# Patient Record
Sex: Male | Born: 1980 | Race: White | Hispanic: No | Marital: Married | Smoking: Never smoker
Health system: Southern US, Community
[De-identification: ages and names within clinical notes are randomized; demographics above are authoritative.]

## PROBLEM LIST (undated history)

## (undated) DIAGNOSIS — R011 Cardiac murmur, unspecified: Secondary | ICD-10-CM

## (undated) DIAGNOSIS — Z8619 Personal history of other infectious and parasitic diseases: Secondary | ICD-10-CM

## (undated) HISTORY — DX: Personal history of other infectious and parasitic diseases: Z86.19

## (undated) HISTORY — PX: HERNIA REPAIR: SHX51

## (undated) HISTORY — DX: Cardiac murmur, unspecified: R01.1

---
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
## 2010-08-15 ENCOUNTER — Encounter: Payer: Self-pay | Admitting: Internal Medicine

## 2010-08-15 ENCOUNTER — Ambulatory Visit (INDEPENDENT_AMBULATORY_CARE_PROVIDER_SITE_OTHER): Payer: BC Managed Care – PPO | Admitting: Internal Medicine

## 2010-08-15 VITALS — BP 120/74 | HR 68 | Temp 98.1°F | Resp 16 | Ht 78.25 in | Wt 200.0 lb

## 2010-08-15 DIAGNOSIS — Z0000 Encounter for general adult medical examination without abnormal findings: Secondary | ICD-10-CM

## 2010-08-15 MED ORDER — TRIAMCINOLONE ACETONIDE 0.5 % EX CREA: TOPICAL_CREAM | Freq: Two times a day (BID) | CUTANEOUS | Status: AC

## 2010-08-15 NOTE — Progress Notes (Signed)
  Subjective:    Patient ID: Lance Jordan, male    DOB: [birth year], 30 y.o.   MRN: [id number]  HPI  30 year old patient who is seen today to establish with our practice he has enjoyed excellent health. There was some concern about mild hypertension in the past but with weight loss he has been normotensive. He is never required any treatment. He enjoys excellent health without concerns or complaints. Past medical history is unremarkable. No surgery he does have a history of Osgood-Schlatter's disease when he was younger. He is very active physically and has no exercise limitations or knee pain. 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 He works for [employer]. He is married one 96-month-old child and expecting his second child. Nonsmoker does exercise regularly Family history father and mother both age 82 in excellent health one sister is also in good health. Maternal grandmother history of ovarian cancer paternal grandmother breast cancer paternal grandfather prostate cancer mother history of mild dyslipidemia    Review of Systems  Constitutional: Negative for fever, chills, activity change, appetite change and fatigue.  HENT: Negative for hearing loss, ear pain, congestion, rhinorrhea, sneezing, mouth sores, trouble swallowing, neck pain, neck stiffness, dental problem, voice change, sinus pressure and tinnitus.   Eyes: Negative for photophobia, pain, redness and visual disturbance.  Respiratory: Negative for apnea, cough, choking, chest tightness, shortness of breath and wheezing.   Cardiovascular: Negative for chest pain, palpitations and leg swelling.  Gastrointestinal: Negative for nausea, vomiting, abdominal pain, diarrhea, constipation, blood in stool, abdominal distention, anal bleeding and rectal pain.  Genitourinary: Negative for dysuria, urgency, frequency, hematuria, flank pain, decreased urine volume, discharge,  penile swelling, scrotal swelling, difficulty urinating, genital sores and testicular pain.  Musculoskeletal: Negative for myalgias, back pain, joint swelling, arthralgias and gait problem.  Skin: Negative for color change, rash and wound.  Neurological: Negative for dizziness, tremors, seizures, syncope, facial asymmetry, speech difficulty, weakness, light-headedness, numbness and headaches.  Hematological: Negative for adenopathy. Does not bruise/bleed easily.  Psychiatric/Behavioral: Negative for suicidal ideas, hallucinations, behavioral problems, confusion, sleep disturbance, self-injury, dysphoric mood, decreased concentration and agitation. The patient is not nervous/anxious.        Objective:   Physical Exam  Constitutional: He is oriented to person, place, and time. He appears well-developed.  HENT:  Head: Normocephalic.  Right Ear: External ear normal.  Left Ear: External ear normal.  Eyes: Conjunctivae and EOM are normal.  Neck: Normal range of motion.  Cardiovascular: Normal rate and normal heart sounds.   Pulmonary/Chest: Breath sounds normal.  Abdominal: Bowel sounds are normal.  Musculoskeletal: Normal range of motion. He exhibits no edema and no tenderness.  Neurological: He is alert and oriented to person, place, and time.  Psychiatric: He has a normal mood and affect. His behavior is normal.          Assessment & Plan:   Unremarkable health maintenance examination. Exercise encouraged. Will return here when necessary.

## 2010-08-15 NOTE — Patient Instructions (Signed)
It is important that you exercise regularly, at least 20 minutes 3 to 4 times per week.  If you develop chest pain or shortness of breath seek  medical attention.  Call or return to clinic prn if these symptoms worsen or fail to improve as anticipated.  

## 2010-12-12 ENCOUNTER — Encounter: Payer: Self-pay | Admitting: Internal Medicine

## 2010-12-12 ENCOUNTER — Ambulatory Visit (INDEPENDENT_AMBULATORY_CARE_PROVIDER_SITE_OTHER): Payer: BC Managed Care – PPO | Admitting: Internal Medicine

## 2010-12-12 VITALS — BP 160/100 | Temp 98.2°F | Wt 205.0 lb

## 2010-12-12 DIAGNOSIS — L290 Pruritus ani: Secondary | ICD-10-CM

## 2010-12-12 MED ORDER — HYDROCORTISONE 2.5 % RE CREA: TOPICAL_CREAM | RECTAL | Status: AC

## 2010-12-12 NOTE — Patient Instructions (Signed)
Call or return to clinic prn if these symptoms worsen or fail to improve as anticipated.

## 2010-12-12 NOTE — Progress Notes (Signed)
  Subjective:    Patient ID: Lance Jordan, male    DOB: [birth year], 30 y.o.   MRN: [id number]  HPI  30 -year-old patient who presents with a several week history of intermittent perirectal pain, intermittent bleeding and itching. He does exercise regularly which includes workouts on an exercise bike.    Review of Systems  Skin:       Perirectal itching and bleeding       Objective:   Physical Exam  Constitutional:       BP blood pressure 150/70  Genitourinary:       The skin in the perirectal area was quite thickened erythematous with fissuring. No obvious hemorrhoid noted          Assessment & Plan:   Pruritus and with chronic perirectal dermatitis. Local skin care discussed he will be treated with short-term Anusol-HC cream. He will avoid alert vigorous cleansing, trauma and  attempt to keep the area clean and dry and free of moisture

## 2011-12-07 ENCOUNTER — Encounter: Payer: Self-pay | Admitting: Internal Medicine

## 2011-12-07 ENCOUNTER — Ambulatory Visit (INDEPENDENT_AMBULATORY_CARE_PROVIDER_SITE_OTHER): Payer: BC Managed Care – PPO | Admitting: Internal Medicine

## 2011-12-07 VITALS — BP 160/80 | HR 84 | Temp 98.6°F | Resp 18 | Wt 209.0 lb

## 2011-12-07 DIAGNOSIS — L723 Sebaceous cyst: Secondary | ICD-10-CM

## 2011-12-07 NOTE — Patient Instructions (Signed)
Call or return to clinic prn if these symptoms worsen or fail to improve as anticipated.

## 2011-12-07 NOTE — Progress Notes (Signed)
  Subjective:    Patient ID: Lance Jordan, male    DOB: [birth year], 31 y.o.   MRN: [id number]  HPI  31 year old patient nonsmoker who presents with a two-day history of a tender nodule in the left lateral neck area.  Past Medical History  Diagnosis Date  . History of chicken pox     History   Social History  . Marital Status: Married    Spouse Name: N/A    Number of Children: N/A  . Years of Education: N/A   Occupational History  . Not on file.   Social History Main Topics  . Smoking status: Never Smoker   . Smokeless tobacco: Never Used  . Alcohol Use: Yes  . Drug Use: No  . Sexually Active: Not on file   Other Topics Concern  . Not on file   Social History Narrative  . No narrative on file    No past surgical history on file.  Family History  Problem Relation Age of Onset  . Hyperlipidemia Mother   . Cancer Maternal Grandmother     ovarian ca  . Hyperlipidemia Paternal Grandmother   . Arthritis Paternal Grandmother   . Heart disease Paternal Grandmother   . Cancer Paternal Grandmother     breast ca    No Known Allergies  Current Outpatient Prescriptions on File Prior to Visit  Medication Sig Dispense Refill  . hydrocortisone (ANUSOL-HC) 2.5 % rectal cream Apply rectally 3  times daily  30 g  1    BP 160/80  Pulse 84  Temp 98.6 F (37 C) (Oral)  Resp 18  Wt 209 lb (94.802 kg)       Review of Systems  Skin: Positive for wound.       Objective:   Physical Exam  Constitutional: He appears well-developed and well-nourished. No distress.  Skin:       And 8-10 mm slightly tender nodule in the left lateral neck area at the junction of his beard and shaved area. This appears to be a slightly inflamed sebaceous cyst and not adenopathy          Assessment & Plan:   Mildly inflamed  sebaceous cyst. Local skin care discussed. Will call if unimproved

## 2014-10-04 ENCOUNTER — Telehealth: Payer: Self-pay | Admitting: Internal Medicine

## 2014-10-04 NOTE — Telephone Encounter (Signed)
Pt will call back in the am with that information

## 2014-10-04 NOTE — Telephone Encounter (Signed)
What Titer does he need?

## 2014-10-04 NOTE — Telephone Encounter (Signed)
[facility] and they need immunization list. Pt does not have and is requesting a titer. Ok to schedule?

## 2014-10-05 NOTE — Telephone Encounter (Signed)
Can I work him in somwhere?

## 2014-10-05 NOTE — Telephone Encounter (Signed)
Yes, you can

## 2014-10-05 NOTE — Telephone Encounter (Signed)
Pt has been scheduled.  °

## 2014-10-05 NOTE — Telephone Encounter (Signed)
Please schedule physical for pt and then when seen can order titers. Please make sure pt brings along paperwork. Pt not seen since 2013.

## 2014-10-05 NOTE — Telephone Encounter (Signed)
Pt states his paperwork needs MMR, tdap, varicella, hep b and may need mennigitis.  He can probably just get the tdap and heb b at visit. Please advise on how to schedule. Thanks.

## 2014-10-25 ENCOUNTER — Encounter: Payer: Self-pay | Admitting: Internal Medicine

## 2014-10-25 ENCOUNTER — Ambulatory Visit (INDEPENDENT_AMBULATORY_CARE_PROVIDER_SITE_OTHER): Payer: BLUE CROSS/BLUE SHIELD | Admitting: Internal Medicine

## 2014-10-25 VITALS — BP 144/80 | HR 101 | Temp 98.5°F | Resp 14 | Ht 76.5 in | Wt 226.0 lb

## 2014-10-25 DIAGNOSIS — Z0000 Encounter for general adult medical examination without abnormal findings: Secondary | ICD-10-CM

## 2014-10-25 DIAGNOSIS — Z23 Encounter for immunization: Secondary | ICD-10-CM | POA: Diagnosis not present

## 2014-10-25 LAB — CBC WITH DIFFERENTIAL/PLATELET
Basophils Absolute: 0 10*3/uL (ref 0.0–0.1)
Basophils Relative: 0.5 % (ref 0.0–3.0)
Eosinophils Absolute: 0.2 10*3/uL (ref 0.0–0.7)
Eosinophils Relative: 5.3 % — ABNORMAL HIGH (ref 0.0–5.0)
HCT: 50.5 % (ref 39.0–52.0)
Hemoglobin: 17.5 g/dL — ABNORMAL HIGH (ref 13.0–17.0)
Lymphocytes Relative: 24.6 % (ref 12.0–46.0)
Lymphs Abs: 1.1 10*3/uL (ref 0.7–4.0)
MCHC: 34.7 g/dL (ref 30.0–36.0)
MCV: 89.8 fl (ref 78.0–100.0)
Monocytes Absolute: 0.4 10*3/uL (ref 0.1–1.0)
Monocytes Relative: 9.2 % (ref 3.0–12.0)
Neutro Abs: 2.7 10*3/uL (ref 1.4–7.7)
Neutrophils Relative %: 60.4 % (ref 43.0–77.0)
Platelets: 150 10*3/uL (ref 150.0–400.0)
RBC: 5.62 Mil/uL (ref 4.22–5.81)
RDW: 12.6 % (ref 11.5–15.5)
WBC: 4.4 10*3/uL (ref 4.0–10.5)

## 2014-10-25 LAB — COMPREHENSIVE METABOLIC PANEL
ALT: 40 U/L (ref 0–53)
AST: 28 U/L (ref 0–37)
Albumin: 4.3 g/dL (ref 3.5–5.2)
Alkaline Phosphatase: 39 U/L (ref 39–117)
BUN: 14 mg/dL (ref 6–23)
CO2: 31 mEq/L (ref 19–32)
Calcium: 9.6 mg/dL (ref 8.4–10.5)
Chloride: 101 mEq/L (ref 96–112)
Creatinine, Ser: 1.18 mg/dL (ref 0.40–1.50)
GFR: 74.9 mL/min (ref >60.00)
Glucose, Bld: 94 mg/dL (ref 70–99)
Potassium: 4.7 mEq/L (ref 3.5–5.1)
Sodium: 139 mEq/L (ref 135–145)
Total Bilirubin: 0.8 mg/dL (ref 0.2–1.2)
Total Protein: 6.8 g/dL (ref 6.0–8.3)

## 2014-10-25 LAB — LIPID PANEL
Cholesterol: 215 mg/dL — ABNORMAL HIGH (ref 0–200)
HDL: 64.5 mg/dL (ref >39.00)
LDL Cholesterol: 135 mg/dL — ABNORMAL HIGH (ref 0–99)
NonHDL: 150.58
Total CHOL/HDL Ratio: 3
Triglycerides: 80 mg/dL (ref 0.0–149.0)
VLDL: 16 mg/dL (ref 0.0–40.0)

## 2014-10-25 LAB — TSH: TSH: 1.26 u[IU]/mL (ref 0.35–4.50)

## 2014-10-25 NOTE — Progress Notes (Signed)
Pre visit review using our clinic review tool, if applicable. No additional management support is needed unless otherwise documented below in the visit note. 

## 2014-10-25 NOTE — Patient Instructions (Signed)

## 2014-10-25 NOTE — Progress Notes (Signed)
  Subjective:    Patient ID: Lance NakayamaWyatt A Jordan, male    DOB: 09/17/1980, 34 y.o.   MRN: [id number]  HPI 34 year-old patient who is seen today for a preventive health examination;  he has enjoyed excellent health. There was some concern about mild hypertension in the past but with weight loss he has been normotensive. He is never required any treatment. Past medical history is unremarkable. No surgery he does have a history of Osgood-Schlatter's disease when he was younger. He is very active physically and has no exercise limitations or knee pain.  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 He works for [employer]. He is married; 2 children.  Nonsmoker does exercise regularly  Family history father and mother both  in excellent health;  one sister is also in good health. Maternal grandmother history of ovarian cancer paternal grandmother breast cancer paternal grandfather prostate cancer mother history of mild dyslipidemia    Review of Systems  Constitutional: Negative for fever, chills, activity change, appetite change and fatigue.  HENT: Negative for congestion, dental problem, ear pain, hearing loss, mouth sores, rhinorrhea, sinus pressure, sneezing, tinnitus, trouble swallowing and voice change.   Eyes: Negative for photophobia, pain, redness and visual disturbance.  Respiratory: Negative for apnea, cough, choking, chest tightness, shortness of breath and wheezing.   Cardiovascular: Negative for chest pain, palpitations and leg swelling.  Gastrointestinal: Negative for nausea, vomiting, abdominal pain, diarrhea, constipation, blood in stool, abdominal distention, anal bleeding and rectal pain.  Genitourinary: Negative for dysuria, urgency, frequency, hematuria, flank pain, decreased urine volume, discharge, penile swelling, scrotal swelling, difficulty urinating, genital sores and testicular pain.  Musculoskeletal: Negative for  myalgias, back pain, joint swelling, arthralgias, gait problem, neck pain and neck stiffness.  Skin: Negative for color change, rash and wound.  Neurological: Negative for dizziness, tremors, seizures, syncope, facial asymmetry, speech difficulty, weakness, light-headedness, numbness and headaches.  Hematological: Negative for adenopathy. Does not bruise/bleed easily.  Psychiatric/Behavioral: Negative for suicidal ideas, hallucinations, behavioral problems, confusion, sleep disturbance, self-injury, dysphoric mood, decreased concentration and agitation. The patient is not nervous/anxious.        Objective:   Physical Exam  Constitutional: He is oriented to person, place, and time. He appears well-developed.  HENT:  Head: Normocephalic.  Right Ear: External ear normal.  Left Ear: External ear normal.  Eyes: Conjunctivae and EOM are normal.  Neck: Normal range of motion.  Cardiovascular: Normal rate and normal heart sounds.   Pulmonary/Chest: Breath sounds normal.  Abdominal: Bowel sounds are normal.  Musculoskeletal: Normal range of motion. He exhibits no edema or tenderness.  Neurological: He is alert and oriented to person, place, and time.  Psychiatric: He has a normal mood and affect. His behavior is normal.          Assessment & Plan:   Unremarkable health maintenance examination. Exercise encouraged. Will return here when necessary.  We'll check updated fasting lab

## 2014-11-05 ENCOUNTER — Encounter: Payer: Self-pay | Admitting: Internal Medicine

## 2014-11-05 NOTE — Telephone Encounter (Signed)
Please see message and advise lab orders.

## 2014-11-09 ENCOUNTER — Ambulatory Visit (INDEPENDENT_AMBULATORY_CARE_PROVIDER_SITE_OTHER): Payer: BLUE CROSS/BLUE SHIELD | Admitting: *Deleted

## 2014-11-09 ENCOUNTER — Other Ambulatory Visit: Payer: Self-pay | Admitting: Internal Medicine

## 2014-11-09 DIAGNOSIS — Z23 Encounter for immunization: Secondary | ICD-10-CM | POA: Diagnosis not present

## 2014-11-09 DIAGNOSIS — Z299 Encounter for prophylactic measures, unspecified: Secondary | ICD-10-CM

## 2014-11-10 LAB — VARICELLA ZOSTER ANTIBODY, IGG: Varicella IgG: 1057 Index — ABNORMAL HIGH (ref <135.00)

## 2014-12-14 ENCOUNTER — Telehealth: Payer: Self-pay | Admitting: *Deleted

## 2014-12-14 ENCOUNTER — Ambulatory Visit (INDEPENDENT_AMBULATORY_CARE_PROVIDER_SITE_OTHER): Payer: BLUE CROSS/BLUE SHIELD | Admitting: *Deleted

## 2014-12-14 DIAGNOSIS — Z23 Encounter for immunization: Secondary | ICD-10-CM | POA: Diagnosis not present

## 2014-12-14 NOTE — Telephone Encounter (Signed)
Error

## 2015-04-05 ENCOUNTER — Ambulatory Visit (INDEPENDENT_AMBULATORY_CARE_PROVIDER_SITE_OTHER): Payer: 59 | Admitting: Internal Medicine

## 2015-04-05 ENCOUNTER — Encounter: Payer: Self-pay | Admitting: Internal Medicine

## 2015-04-05 VITALS — BP 158/90 | HR 87 | Temp 98.5°F | Resp 20 | Ht 76.5 in | Wt 235.0 lb

## 2015-04-05 DIAGNOSIS — R091 Pleurisy: Secondary | ICD-10-CM

## 2015-04-05 NOTE — Patient Instructions (Addendum)
Pleurisy Pleurisy is an inflammation and swelling of the lining of the lungs (pleura). Because of this inflammation, it hurts to breathe. It can be aggravated by coughing, laughing, or deep breathing. Pleurisy is often caused by an underlying infection or disease.  HOME CARE INSTRUCTIONS  Monitor your pleurisy for any changes. The following actions may help to alleviate any discomfort you are experiencing:  Medicine may help with pain. Only take over-the-counter or prescription medicines for pain, discomfort, or fever as directed by your health care provider.  Only take antibiotic medicine as directed. Make sure to finish it even if you start to feel better. SEEK MEDICAL CARE IF:   Your pain is not controlled with medicine or is increasing.  You have an increase in pus-like (purulent) secretions brought up with coughing. SEEK IMMEDIATE MEDICAL CARE IF:   You have blue or dark lips, fingernails, or toenails.  You are coughing up blood.  You have increased difficulty breathing.  You have continuing pain unrelieved by medicine or pain lasting more than 1 week.  You have pain that radiates into your neck, arms, or jaw.  You develop increased shortness of breath or wheezing.  You develop a fever, rash, vomiting, fainting, or other serious symptoms. MAKE SURE YOU:  Understand these instructions.   Will watch your condition.   Will get help right away if you are not doing well or get worse.    This information is not intended to replace advice given to you by your health care provider. Make sure you discuss any questions you have with your health care provider.   Document Released: 12/18/2004 Document Revised: 08/20/2012 Document Reviewed: 06/01/2012 Elsevier Interactive Patient Education 2016 [employer]   Limit your sodium (Salt) intake  Please check your blood pressure on a regular basis.  If it is consistently greater than 150/90, please make an office appointment.

## 2015-04-05 NOTE — Progress Notes (Signed)
Pre visit review using our clinic review tool, if applicable. No additional management support is needed unless otherwise documented below in the visit note. 

## 2015-04-05 NOTE — Progress Notes (Signed)
Subjective:    Patient ID: Lance Jordan, Lance Jordan    DOB: 01/17/1980, 35 y.o.   MRN: [id number]  HPI  35 year old patient who enjoys excellent health.  He stated that he started jogging about 1 week ago and for the past 2 or 3 days she has noted pain in the left posterolateral chest wall area.  Pain is aggravated by deep inspiration, but he has no discomfort with jogging.  Denies any cough or hemoptysis or shortness of breath.  Denies any fever. No history of chest wall trauma  Past Medical History  Diagnosis Date  . History of chicken pox     Social History   Social History  . Marital Status: Married    Spouse Name: N/A  . Number of Children: N/A  . Years of Education: N/A   Occupational History  . Not on file.   Social History Main Topics  . Smoking status: Never Smoker   . Smokeless tobacco: Never Used  . Alcohol Use: Yes  . Drug Use: No  . Sexual Activity: Not on file   Other Topics Concern  . Not on file   Social History Narrative    No past surgical history on file.  Family History  Problem Relation Age of Onset  . Hyperlipidemia Mother   . Cancer Maternal Grandmother     ovarian ca  . Hyperlipidemia Paternal Grandmother   . Arthritis Paternal Grandmother   . Heart disease Paternal Grandmother   . Cancer Paternal Grandmother     breast ca    No Known Allergies  No current outpatient prescriptions on file prior to visit.   No current facility-administered medications on file prior to visit.    BP 158/90 mmHg  Pulse 87  Temp(Src) 98.5 F (36.9 C) (Oral)  Resp 20  Ht 6' 4.5" (1.943 m)  Wt 235 lb (106.595 kg)  BMI 28.24 kg/m2  SpO2 99%     Review of Systems  Constitutional: Negative for fever, chills, appetite change and fatigue.  HENT: Negative for congestion, dental problem, ear pain, hearing loss, sore throat, tinnitus, trouble swallowing and voice change.   Eyes: Negative for pain, discharge and visual disturbance.  Respiratory:  Negative for cough, chest tightness, shortness of breath, wheezing and stridor.   Cardiovascular: Positive for chest pain. Negative for palpitations and leg swelling.  Gastrointestinal: Negative for nausea, vomiting, abdominal pain, diarrhea, constipation, blood in stool and abdominal distention.  Genitourinary: Negative for urgency, hematuria, flank pain, discharge, difficulty urinating and genital sores.  Musculoskeletal: Negative for myalgias, back pain, joint swelling, arthralgias, gait problem and neck stiffness.  Skin: Negative for rash.  Neurological: Negative for dizziness, syncope, speech difficulty, weakness, numbness and headaches.  Hematological: Negative for adenopathy. Does not bruise/bleed easily.  Psychiatric/Behavioral: Negative for behavioral problems and dysphoric mood. The patient is not nervous/anxious.        Objective:   Physical Exam  Constitutional: He is oriented to person, place, and time. He appears well-developed and well-nourished.  HENT:  Head: Normocephalic.  Right Ear: External ear normal.  Left Ear: External ear normal.  Eyes: Conjunctivae and EOM are normal.  Neck: Normal range of motion.  Cardiovascular: Normal rate and normal heart sounds.   Pulmonary/Chest: Effort normal and breath sounds normal. No respiratory distress. He has no wheezes. He has no rales. He exhibits tenderness.  Very mild tenderness over the left posterior lateral chest wall area Pain increased with inspiration Chest clear  O2 saturation 99% Pulse 80  Abdominal: Bowel sounds are normal.  Musculoskeletal: Normal range of motion. He exhibits no edema or tenderness.  Neurological: He is alert and oriented to person, place, and time.  Psychiatric: He has a normal mood and affect. His behavior is normal.          Assessment & Plan:   Pleurisy.  Will continue ibuprofen and clinically observe.  Patient will report any new or worsening symptoms such as fever, cough or shortness  of breath

## 2015-05-17 ENCOUNTER — Ambulatory Visit: Payer: BLUE CROSS/BLUE SHIELD | Admitting: *Deleted

## 2015-05-24 ENCOUNTER — Encounter: Payer: Self-pay | Admitting: Internal Medicine

## 2015-05-24 ENCOUNTER — Ambulatory Visit: Payer: 59 | Admitting: *Deleted

## 2015-05-24 ENCOUNTER — Ambulatory Visit (INDEPENDENT_AMBULATORY_CARE_PROVIDER_SITE_OTHER): Payer: 59 | Admitting: Internal Medicine

## 2015-05-24 VITALS — BP 140/80 | HR 80 | Temp 98.3°F | Resp 20 | Ht 76.5 in | Wt 236.0 lb

## 2015-05-24 DIAGNOSIS — Z23 Encounter for immunization: Secondary | ICD-10-CM

## 2015-05-24 DIAGNOSIS — R0789 Other chest pain: Secondary | ICD-10-CM | POA: Diagnosis not present

## 2015-05-24 NOTE — Patient Instructions (Signed)
Call or return to clinic prn if these symptoms worsen or fail to improve as anticipated.

## 2015-05-24 NOTE — Progress Notes (Signed)
Subjective:    Patient ID: Lance Jordan, male    DOB: October 09, 1980, 35 y.o.   MRN: [id number]  HPI  35 year old patient who is seen today for his third and final hepatitis B vaccine.  He was seen in the last month with some left posterior lateral pleurisy.  This has resolved, but subsequently he has had some vague discomfort involving the left upper inner arm as well as left upper lateral chest wall area.  The vague discomfort spares the axilla.  He states pain is aggravated by prolonged standing and alleviated by sitting activities such as golf and gym activities do not seem to aggravate the discomfort.  The discomfort has not been significant enough to take ibuprofen.  Past Medical History  Diagnosis Date  . History of chicken pox      Social History   Social History  . Marital Status: Married    Spouse Name: N/A  . Number of Children: N/A  . Years of Education: N/A   Occupational History  . Not on file.   Social History Main Topics  . Smoking status: Never Smoker   . Smokeless tobacco: Never Used  . Alcohol Use: Yes  . Drug Use: No  . Sexual Activity: Not on file   Other Topics Concern  . Not on file   Social History Narrative    No past surgical history on file.  Family History  Problem Relation Age of Onset  . Hyperlipidemia Mother   . Cancer Maternal Grandmother     ovarian ca  . Hyperlipidemia Paternal Grandmother   . Arthritis Paternal Grandmother   . Heart disease Paternal Grandmother   . Cancer Paternal Grandmother     breast ca    No Known Allergies  Current Outpatient Prescriptions on File Prior to Visit  Medication Sig Dispense Refill  . ibuprofen (ADVIL,MOTRIN) 200 MG tablet Take 400 mg by mouth as needed.     No current facility-administered medications on file prior to visit.    BP 140/80 mmHg  Pulse 80  Temp(Src) 98.3 F (36.8 C) (Oral)  Resp 20  Ht 6' 4.5" (1.943 m)  Wt 236 lb (107.049 kg)  BMI 28.36 kg/m2  SpO2  99%     Review of Systems  Cardiovascular: Positive for chest pain.       Objective:   Physical Exam  Constitutional: He is oriented to person, place, and time. He appears well-developed.  HENT:  Head: Normocephalic.  Right Ear: External ear normal.  Left Ear: External ear normal.  Eyes: Conjunctivae and EOM are normal.  Neck: Normal range of motion.  Cardiovascular: Normal rate and normal heart sounds.   Pulmonary/Chest: Effort normal and breath sounds normal. He exhibits no tenderness.  No chest wall tenderness No rash Axilla normal No focal tenderness  Abdominal: Bowel sounds are normal.  Musculoskeletal: Normal range of motion. He exhibits no edema or tenderness.  Neurological: He is alert and oriented to person, place, and time.  Psychiatric: He has a normal mood and affect. His behavior is normal.          Assessment & Plan:   Vague left upper arm and lateral chest wall discomfort.  Unremarkable exam.  Will observe at this time.  He'll report any new or worsening symptoms  Rogelia Boga, MD

## 2015-05-24 NOTE — Progress Notes (Signed)
Pre visit review using our clinic review tool, if applicable. No additional management support is needed unless otherwise documented below in the visit note. 

## 2015-05-24 NOTE — Addendum Note (Signed)
Addended by: Jimmye NormanPHANOS, Kveon Casanas J on: 05/24/2015 10:34 AM   Modules accepted: Orders

## 2015-07-27 ENCOUNTER — Ambulatory Visit (INDEPENDENT_AMBULATORY_CARE_PROVIDER_SITE_OTHER): Payer: 59 | Admitting: Internal Medicine

## 2015-07-27 ENCOUNTER — Ambulatory Visit (INDEPENDENT_AMBULATORY_CARE_PROVIDER_SITE_OTHER)
Admission: RE | Admit: 2015-07-27 | Discharge: 2015-07-27 | Disposition: A | Discharge status: Home / Self Care | Payer: 59 | Source: Ambulatory Visit | Attending: Internal Medicine | Admitting: Internal Medicine

## 2015-07-27 ENCOUNTER — Telehealth: Payer: Self-pay | Admitting: Internal Medicine

## 2015-07-27 ENCOUNTER — Encounter: Payer: Self-pay | Admitting: Internal Medicine

## 2015-07-27 VITALS — BP 150/70 | HR 91 | Temp 98.3°F | Ht 76.5 in | Wt 236.0 lb

## 2015-07-27 DIAGNOSIS — R0789 Other chest pain: Secondary | ICD-10-CM | POA: Diagnosis not present

## 2015-07-27 NOTE — Telephone Encounter (Signed)
Pt returning your call. Please call back °

## 2015-07-27 NOTE — Progress Notes (Signed)
Subjective:    Patient ID: Lance Jordan, male    DOB: June 22, 1980, 35 y.o.   MRN: [id number]  HPI  35 year old patient who was seen in April with left postero-lateral pleurisy.  He was seen in follow-up the following month in the pleurisy had resolved but he continued to have some vague discomfort in the left lateral chest wall and occasionally left arm. He continues to have a vague discomfort in the left anterolateral chest wall and occasional left arm.  He describes some occasional paresthesias of the left arm that involve the left third and fourth fingers. No real aggravating factors at the present time.  He states the only time he is really pain free as when he was doing vigorous exercise.  Denies any neck pain.  No motor weakness.  No constitutional complaints  Past Medical History:  Diagnosis Date  . History of chicken pox      Social History   Social History  . Marital status: Married    Spouse name: N/A  . Number of children: N/A  . Years of education: N/A   Occupational History  . Not on file.   Social History Main Topics  . Smoking status: Never Smoker  . Smokeless tobacco: Never Used  . Alcohol use Yes  . Drug use: No  . Sexual activity: Not on file   Other Topics Concern  . Not on file   Social History Narrative  . No narrative on file    No past surgical history on file.  Family History  Problem Relation Age of Onset  . Hyperlipidemia Mother   . Cancer Maternal Grandmother     ovarian ca  . Hyperlipidemia Paternal Grandmother   . Arthritis Paternal Grandmother   . Heart disease Paternal Grandmother   . Cancer Paternal Grandmother     breast ca    No Known Allergies  Current Outpatient Prescriptions on File Prior to Visit  Medication Sig Dispense Refill  . ibuprofen (ADVIL,MOTRIN) 200 MG tablet Take 400 mg by mouth as needed.     No current facility-administered medications on file prior to visit.     BP (!) 150/70 (BP Location: Right Arm,  Patient Position: Sitting, Cuff Size: Normal)   Pulse 91   Temp 98.3 F (36.8 C) (Oral)   Ht 6' 4.5" (1.943 m)   Wt 236 lb (107 kg)   SpO2 99%   BMI 28.35 kg/m     Review of Systems  Constitutional: Negative for appetite change, chills, fatigue and fever.  HENT: Negative for congestion, dental problem, ear pain, hearing loss, sore throat, tinnitus, trouble swallowing and voice change.   Eyes: Negative for pain, discharge and visual disturbance.  Respiratory: Negative for cough, chest tightness, wheezing and stridor.   Cardiovascular: Positive for chest pain and palpitations. Negative for leg swelling.  Gastrointestinal: Negative for abdominal distention, abdominal pain, blood in stool, constipation, diarrhea, nausea and vomiting.  Genitourinary: Negative for difficulty urinating, discharge, flank pain, genital sores, hematuria and urgency.  Musculoskeletal: Negative for arthralgias, back pain, gait problem, joint swelling, myalgias and neck stiffness.  Skin: Negative for rash.  Neurological: Negative for dizziness, syncope, speech difficulty, weakness, numbness and headaches.  Hematological: Negative for adenopathy. Does not bruise/bleed easily.  Psychiatric/Behavioral: Negative for behavioral problems and dysphoric mood. The patient is not nervous/anxious.        Objective:   Physical Exam  Constitutional: He is oriented to person, place, and time. He appears well-developed.  HENT:  Head: Normocephalic.  Right Ear: External ear normal.  Left Ear: External ear normal.  Eyes: Conjunctivae and EOM are normal.  Neck: Normal range of motion.  Cardiovascular: Normal rate and normal heart sounds.   Pulmonary/Chest: Breath sounds normal. He exhibits no tenderness.  Abdominal: Bowel sounds are normal.  Musculoskeletal: Normal range of motion. He exhibits no edema or tenderness.  Neurological: He is alert and oriented to person, place, and time.  Normal.  Arm extension and  flexion Reflexes normal Grip strength, normal  Psychiatric: He has a normal mood and affect. His behavior is normal.          Assessment & Plan:   Left chest wall and arm discomfort.  Occasional paresthesias involving the left arm.  History of left posterior lateral pleurisy.  Clinical exam unremarkable.  Unclear whether this represents a cervical or thoracic radiculopathy.  We'll obtain a chest x-ray  Rogelia Boga, MD

## 2015-07-27 NOTE — Patient Instructions (Signed)
Chest x-ray as discussed  Please report any new or worsening symptoms

## 2015-11-07 ENCOUNTER — Encounter: Payer: Self-pay | Admitting: Internal Medicine

## 2015-11-11 ENCOUNTER — Encounter: Payer: Self-pay | Admitting: Internal Medicine

## 2015-11-11 ENCOUNTER — Ambulatory Visit (INDEPENDENT_AMBULATORY_CARE_PROVIDER_SITE_OTHER): Payer: 59 | Admitting: Internal Medicine

## 2015-11-11 VITALS — BP 152/96 | HR 83 | Temp 98.4°F | Wt 240.2 lb

## 2015-11-11 DIAGNOSIS — R002 Palpitations: Secondary | ICD-10-CM | POA: Diagnosis not present

## 2015-11-11 DIAGNOSIS — I499 Cardiac arrhythmia, unspecified: Secondary | ICD-10-CM

## 2015-11-11 NOTE — Progress Notes (Signed)
Pre visit review using our clinic review tool, if applicable. No additional management support is needed unless otherwise documented below in the visit note. 

## 2015-11-11 NOTE — Patient Instructions (Addendum)

## 2015-11-11 NOTE — Progress Notes (Signed)
Subjective:    Patient ID: Lance Jordan, male    DOB: 11/02/1980, 35 y.o.   MRN: [id number]  HPI 35 year old patient who is seen today with a chief complaint of irregular heart rate. For the past several months, he has had 5-6 episodes of irregular heartbeat.  He describes a skipping sensation but without tachycardia.  He has no associated symptoms.  He states these episodes of irregularity last 30 minutes to as long as 3 hours.  He states that they are frequently aggravated by expresso and alcohol use. He has had a recent chest x-ray that revealed normal heart size.  He exercises regularly and has no exertional related symptoms or aggravation of his palpitations. No family history of premature coronary artery disease  EKG was reviewed today and was probably within normal limits.  The revealed a slightly notched P wave and some nonspecific T-wave flattening and borderline prolonged PR interval  Past Medical History:  Diagnosis Date  . History of chicken pox      Social History   Social History  . Marital status: Married    Spouse name: N/A  . Number of children: N/A  . Years of education: N/A   Occupational History  . Not on file.   Social History Main Topics  . Smoking status: Never Smoker  . Smokeless tobacco: Never Used  . Alcohol use Yes  . Drug use: No  . Sexual activity: Not on file   Other Topics Concern  . Not on file   Social History Narrative  . No narrative on file    No past surgical history on file.  Family History  Problem Relation Age of Onset  . Hyperlipidemia Mother   . Cancer Maternal Grandmother     ovarian ca  . Hyperlipidemia Paternal Grandmother   . Arthritis Paternal Grandmother   . Heart disease Paternal Grandmother   . Cancer Paternal Grandmother     breast ca    No Known Allergies  Current Outpatient Prescriptions on File Prior to Visit  Medication Sig Dispense Refill  . ibuprofen (ADVIL,MOTRIN) 200 MG tablet Take 400 mg by  mouth as needed.     No current facility-administered medications on file prior to visit.     BP (!) 152/96 (BP Location: Left Arm, Patient Position: Sitting, Cuff Size: Normal)   Pulse 83   Temp 98.4 F (36.9 C) (Oral)   Wt 240 lb 3.2 oz (109 kg)   SpO2 98%   BMI 28.86 kg/m     Review of Systems  Constitutional: Negative.   Respiratory: Negative for cough, chest tightness and shortness of breath.   Cardiovascular: Positive for palpitations. Negative for chest pain and leg swelling.       Objective:   Physical Exam  Constitutional: He appears well-nourished. No distress.  Neck: Normal range of motion.  Cardiovascular: Normal rate, regular rhythm, normal heart sounds and intact distal pulses.  Exam reveals no gallop and no friction rub.   No murmur heard. Pulmonary/Chest: Effort normal and breath sounds normal. No respiratory distress. He has no wheezes.          Assessment & Plan:   Palpitations.  Patient was reassured and patient information dispensed.  Will attempt to moderate aggravating factors such as caffeine use.  Will observe at this time.  He will report any new or worsening symptoms  Borderline high blood pressure today.  He is exercising regularly will attempt salt restriction and modest weight loss.  Annalysa Mohammad  Homero Fellers

## 2016-09-20 ENCOUNTER — Encounter: Payer: Self-pay | Admitting: Internal Medicine

## 2016-09-28 ENCOUNTER — Ambulatory Visit (INDEPENDENT_AMBULATORY_CARE_PROVIDER_SITE_OTHER): Payer: 59 | Admitting: Internal Medicine

## 2016-09-28 ENCOUNTER — Encounter: Payer: Self-pay | Admitting: Internal Medicine

## 2016-09-28 VITALS — BP 152/86 | HR 105 | Temp 98.3°F | Ht 76.5 in | Wt 246.2 lb

## 2016-09-28 DIAGNOSIS — Z23 Encounter for immunization: Secondary | ICD-10-CM

## 2016-09-28 DIAGNOSIS — R109 Unspecified abdominal pain: Secondary | ICD-10-CM | POA: Diagnosis not present

## 2016-09-28 NOTE — Patient Instructions (Addendum)
Avoids foods high in acid such as tomatoes citrus juices, and spicy foods.  Avoid eating within two hours of lying down or before exercising.  Do not overheat.  Try smaller more frequent meals.  If symptoms persist, contact the office  Continue Prilosec for the short-term  Low-Gluten Eating Plan Gluten is a protein that is found in wheat, barley, rye, and some other grains. Some people have a condition that makes them unable to digest gluten. For those people, eating just a small amount of gluten can damage their intestines. This is not a gluten-free eating plan. This low-gluten eating plan is for people who feel better when they eat less gluten. What do I need to know about this eating plan?  You can eat anything that does not contain wheat or other grains that have gluten.  You can eat anything that is labeled "gluten-free."  Make sure to read food labels.  Eat a variety of foods so you get all of the nutrients that you need.  Avoid processed foods and sauces because many of them contain wheat. To have more control over the ingredients in your meals, consider making food yourself instead of buying prepared foods. What foods can I eat? Grains Rice. Bulgur. Quinoa. Corn. Buckwheat. Amaranth. Corn tortillas or taco shells. Oatmeal that is labeled as "gluten free" or "uncontaminated." Vegetables Lettuce. Spinach. Peas. Beets. Cauliflower. Cabbage. Broccoli. Carrots. Tomatoes. Squash. Eggplant. Herbs. Peppers. Onions. Cucumbers. Brussels sprouts. Yams and sweet potatoes. Beans. Lentils. Fruits Bananas. Apples. Oranges. Grapes. Papaya. Mango. Pomegranate. Kiwi. Grapefruit. Cherries. Meats and Other Protein Sources Beef. Pork. Chicken. [place]. Fish. Eggs. Tofu. Beans. Nuts. Lentils. Dairy Milk. Ice cream. Yogurt. Cheese. Cottage cheese. Beverages Water. Coffee. Tea. Juice. Soda. Seltzer water. Condiments Mustard. Relish. Low-fat, low-sugar ketchup. Low-fat, low-sugar barbecue sauce.  Vinegar. Low-fat or fat-free mayonnaise. Sweets and Desserts Honey. Sugar. Maple syrup. Fats and Oils Butter. Vegetable oil. Olive oil. Canola oil. Walnut oil. Other Arrowroot or cornstarch. Potato flour. The items listed above may not be a complete list of recommended foods or beverages. Contact your dietitian for more options. What foods are not recommended? Grains Wheat. Barley. Rye. Oatmeal. Meat and Other Protein Sources Seitan. Cold cuts. Hotdogs. Salami. Sausages. Beverages Beer. Condiments Malt vinegar. Salad dressing. Soy sauce. Sweets and Desserts Licorice. Brown rice syrup. Pre-made pudding or pudding mixes. Other Bouillon cubes. Canned or boxed pre-made soups or soup packets. Bagged chips, such as potato chips and tortilla chips. Seasoning packets. The items listed above may not be a complete list of foods and beverages to avoid. Contact your dietitian for more information. This information is not intended to replace advice given to you by your health care provider. Make sure you discuss any questions you have with your health care provider. Document Released: 05/04/2014 Document Revised: 05/26/2015 Document Reviewed: 01/13/2014 Elsevier Interactive Patient Education  [employer].

## 2016-09-28 NOTE — Progress Notes (Signed)
Subjective:    Patient ID: Lance Jordan, male    DOB: 09/19/1980, 36 y.o.   MRN: [id number]  HPI  36 year old patient who has a 3 month history of abdominal discomfort.  This has intensified over the past month.  He describes a vague dullness in the epigastric and left upper quadrant area. No other symptoms such as anorexia, weight loss, change in his bowel habits.  No nausea He has more recently attempted a bland diet and has been on PPI therapy for 1 week without much benefit Pain seems be aggravated by sitting and is worse in the late afternoon.  He states and lays flat and stretches out.  He becomes pain-free.  No nocturnal symptoms  Past Medical History:  Diagnosis Date  . History of chicken pox      Social History   Social History  . Marital status: Married    Spouse name: N/A  . Number of children: N/A  . Years of education: N/A   Occupational History  . Not on file.   Social History Main Topics  . Smoking status: Never Smoker  . Smokeless tobacco: Never Used  . Alcohol use Yes  . Drug use: No  . Sexual activity: Not on file   Other Topics Concern  . Not on file   Social History Narrative  . No narrative on file    No past surgical history on file.  Family History  Problem Relation Age of Onset  . Hyperlipidemia Mother   . Cancer Maternal Grandmother        ovarian ca  . Hyperlipidemia Paternal Grandmother   . Arthritis Paternal Grandmother   . Heart disease Paternal Grandmother   . Cancer Paternal Grandmother        breast ca    No Known Allergies  Current Outpatient Prescriptions on File Prior to Visit  Medication Sig Dispense Refill  . ibuprofen (ADVIL,MOTRIN) 200 MG tablet Take 400 mg by mouth as needed.     No current facility-administered medications on file prior to visit.     BP (!) 152/86 (BP Location: Left Arm, Patient Position: Sitting, Cuff Size: Normal)   Pulse (!) 105   Temp 98.3 F (36.8 C) (Oral)   Ht 6' 4.5" (1.943 m)    Wt 246 lb 3.2 oz (111.7 kg)   SpO2 99%   BMI 29.58 kg/m     Review of Systems  Constitutional: Negative for appetite change, chills, fatigue and fever.  HENT: Negative for congestion, dental problem, ear pain, hearing loss, sore throat, tinnitus, trouble swallowing and voice change.   Eyes: Negative for pain, discharge and visual disturbance.  Respiratory: Negative for cough, chest tightness, wheezing and stridor.   Cardiovascular: Negative for chest pain, palpitations and leg swelling.  Gastrointestinal: Positive for abdominal pain. Negative for abdominal distention, blood in stool, constipation, diarrhea, nausea and vomiting.  Genitourinary: Negative for difficulty urinating, discharge, flank pain, genital sores, hematuria and urgency.  Musculoskeletal: Negative for arthralgias, back pain, gait problem, joint swelling, myalgias and neck stiffness.  Skin: Negative for rash.  Neurological: Negative for dizziness, syncope, speech difficulty, weakness, numbness and headaches.  Hematological: Negative for adenopathy. Does not bruise/bleed easily.  Psychiatric/Behavioral: Negative for behavioral problems and dysphoric mood. The patient is not nervous/anxious.        Objective:   Physical Exam  Constitutional: He is oriented to person, place, and time. He appears well-developed.  HENT:  Head: Normocephalic.  Right Ear: External ear normal.  Left Ear: External ear normal.  Eyes: Conjunctivae and EOM are normal.  Neck: Normal range of motion.  Cardiovascular: Normal rate and normal heart sounds.   Pulmonary/Chest: Breath sounds normal.  Abdominal: Soft. Bowel sounds are normal. There is tenderness.  Very mild epigastric discomfort No organomegaly  Musculoskeletal: Normal range of motion. He exhibits no edema or tenderness.  Neurological: He is alert and oriented to person, place, and time.  Psychiatric: He has a normal mood and affect. His behavior is normal.            Assessment & Plan:   Nonspecific abdominal pain.  Will continue a bland diet and PPI therapy, at least short-term Report any new or worsening symptoms  Continue home blood pressure monitoring.  Repeat blood pressure 140/80  Rogelia Boga

## 2016-10-31 ENCOUNTER — Encounter: Payer: Self-pay | Admitting: Internal Medicine

## 2016-10-31 ENCOUNTER — Ambulatory Visit (INDEPENDENT_AMBULATORY_CARE_PROVIDER_SITE_OTHER): Payer: 59 | Admitting: Internal Medicine

## 2016-10-31 VITALS — BP 162/82 | HR 94 | Temp 98.2°F | Ht 76.5 in | Wt 243.0 lb

## 2016-10-31 DIAGNOSIS — R109 Unspecified abdominal pain: Secondary | ICD-10-CM | POA: Diagnosis not present

## 2016-10-31 NOTE — Progress Notes (Signed)
Subjective:    Patient ID: Lance Jordan, male    DOB: [birth year], 36 y.o.   MRN: [id number]  HPI 36 year old patient who is seen today for follow-up of abdominal pain.  This continues.  Drill he wakes up pain free, but the discomfort in the epigastric area which is described as a mild annoyance starts around 10:00.  Symptoms intensified throughout the day.  Symptoms are relieved by the supine position.  There is no tenderness to palpation in the epigastric area.  Symptoms are relieved by belching.  Symptoms seem aggravated by alcohol use and Advil.  No nocturnal symptoms.  No constitutional complaints  He has tried a gluten-free diet without benefit.  He has been on PPI therapy without benefit  Past Medical History:  Diagnosis Date  . History of chicken pox      Social History   Social History  . Marital status: Married    Spouse name: N/A  . Number of children: N/A  . Years of education: N/A   Occupational History  . Not on file.   Social History Main Topics  . Smoking status: Never Smoker  . Smokeless tobacco: Never Used  . Alcohol use Yes  . Drug use: No  . Sexual activity: Not on file   Other Topics Concern  . Not on file   Social History Narrative  . No narrative on file    No past surgical history on file.  Family History  Problem Relation Age of Onset  . Hyperlipidemia Mother   . Cancer Maternal Grandmother        ovarian ca  . Hyperlipidemia Paternal Grandmother   . Arthritis Paternal Grandmother   . Heart disease Paternal Grandmother   . Cancer Paternal Grandmother        breast ca    No Known Allergies  Current Outpatient Prescriptions on File Prior to Visit  Medication Sig Dispense Refill  . ibuprofen (ADVIL,MOTRIN) 200 MG tablet Take 400 mg by mouth as needed.     No current facility-administered medications on file prior to visit.     BP (!) 162/82 (BP Location: Left Arm, Patient Position: Sitting, Cuff Size: Normal)   Pulse 94   Temp  98.2 F (36.8 C) (Oral)   Ht 6' 4.5" (1.943 m)   Wt 243 lb (110.2 kg)   SpO2 99%   BMI 29.19 kg/m      Review of Systems  Constitutional: Negative for appetite change, chills, fatigue and fever.  HENT: Negative for congestion, dental problem, ear pain, hearing loss, sore throat, tinnitus, trouble swallowing and voice change.   Eyes: Negative for pain, discharge and visual disturbance.  Respiratory: Negative for cough, chest tightness, wheezing and stridor.   Cardiovascular: Negative for chest pain, palpitations and leg swelling.  Gastrointestinal: Positive for abdominal pain. Negative for abdominal distention, blood in stool, constipation, diarrhea, nausea and vomiting.  Genitourinary: Negative for difficulty urinating, discharge, flank pain, genital sores, hematuria and urgency.  Musculoskeletal: Negative for arthralgias, back pain, gait problem, joint swelling, myalgias and neck stiffness.  Skin: Negative for rash.  Neurological: Negative for dizziness, syncope, speech difficulty, weakness, numbness and headaches.  Hematological: Negative for adenopathy. Does not bruise/bleed easily.  Psychiatric/Behavioral: Negative for behavioral problems and dysphoric mood. The patient is not nervous/anxious.        Objective:   Physical Exam  Constitutional: He is oriented to person, place, and time. He appears well-developed.  Blood pressure 150/90  HENT:  Head: Normocephalic.  Right Ear: External ear normal.  Left Ear: External ear normal.  Eyes: Conjunctivae and EOM are normal.  Neck: Normal range of motion.  Cardiovascular: Normal rate and normal heart sounds.   Pulmonary/Chest: Breath sounds normal.  Abdominal: Bowel sounds are normal. He exhibits no distension. There is no tenderness. There is no rebound and no guarding.  Musculoskeletal: Normal range of motion. He exhibits no edema or tenderness.  Neurological: He is alert and oriented to person, place, and time.  Psychiatric:  He has a normal mood and affect. His behavior is normal.          Assessment & Plan:   Persistent epigastric pain.  Options discussed.  Will check an abdominal ultrasound Continue bland diet.  Discontinue PPI therapy  Hypertensive suspect.  Will place on DASH diet.  Home blood pressure monitoring.  Encouraged  Rogelia Boga

## 2016-10-31 NOTE — Patient Instructions (Addendum)
DASH Eating Plan DASH stands for "Dietary Approaches to Stop Hypertension." The DASH eating plan is a healthy eating plan that has been shown to reduce high blood pressure (hypertension). It may also reduce your risk for type 2 diabetes, heart disease, and stroke. The DASH eating plan may also help with weight loss. What are tips for following this plan? General guidelines  Avoid eating more than 2,300 mg (milligrams) of salt (sodium) a day. If you have hypertension, you may need to reduce your sodium intake to 1,500 mg a day.  Limit alcohol intake to no more than 1 drink a day for nonpregnant women and 2 drinks a day for men. One drink equals 12 oz of beer, 5 oz of wine, or 1 oz of hard liquor.  Work with your health care provider to maintain a healthy body weight or to lose weight. Ask what an ideal weight is for you.  Get at least 30 minutes of exercise that causes your heart to beat faster (aerobic exercise) most days of the week. Activities may include walking, swimming, or biking.  Work with your health care provider or diet and nutrition specialist (dietitian) to adjust your eating plan to your individual calorie needs. Reading food labels  Check food labels for the amount of sodium per serving. Choose foods with less than 5 percent of the Daily Value of sodium. Generally, foods with less than 300 mg of sodium per serving fit into this eating plan.  To find whole grains, look for the word "whole" as the first word in the ingredient list. Shopping  Buy products labeled as "low-sodium" or "no salt added."  Buy fresh foods. Avoid canned foods and premade or frozen meals. Cooking  Avoid adding salt when cooking. Use salt-free seasonings or herbs instead of table salt or sea salt. Check with your health care provider or pharmacist before using salt substitutes.  Do not fry foods. Cook foods using healthy methods such as baking, boiling, grilling, and broiling instead.  Cook with  heart-healthy oils, such as olive, canola, soybean, or sunflower oil. Meal planning   Eat a balanced diet that includes: ? 5 or more servings of fruits and vegetables each day. At each meal, try to fill half of your plate with fruits and vegetables. ? Up to 6-8 servings of whole grains each day. ? Less than 6 oz of lean meat, poultry, or fish each day. A 3-oz serving of meat is about the same size as a deck of cards. One egg equals 1 oz. ? 2 servings of low-fat dairy each day. ? A serving of nuts, seeds, or beans 5 times each week. ? Heart-healthy fats. Healthy fats called Omega-3 fatty acids are found in foods such as flaxseeds and coldwater fish, like sardines, salmon, and mackerel.  Limit how much you eat of the following: ? Canned or prepackaged foods. ? Food that is high in trans fat, such as fried foods. ? Food that is high in saturated fat, such as fatty meat. ? Sweets, desserts, sugary drinks, and other foods with added sugar. ? Full-fat dairy products.  Do not salt foods before eating.  Try to eat at least 2 vegetarian meals each week.  Eat more home-cooked food and less restaurant, buffet, and fast food.  When eating at a restaurant, ask that your food be prepared with less salt or no salt, if possible. What foods are recommended? The items listed may not be a complete list. Talk with your dietitian about what   dietary choices are best for you. Grains Whole-grain or whole-wheat bread. Whole-grain or whole-wheat pasta. Brown rice. Oatmeal. Quinoa. Bulgur. Whole-grain and low-sodium cereals. Pita bread. Low-fat, low-sodium crackers. Whole-wheat flour tortillas. Vegetables Fresh or frozen vegetables (raw, steamed, roasted, or grilled). Low-sodium or reduced-sodium tomato and vegetable juice. Low-sodium or reduced-sodium tomato sauce and tomato paste. Low-sodium or reduced-sodium canned vegetables. Fruits All fresh, dried, or frozen fruit. Canned fruit in natural juice (without  added sugar). Meat and other protein foods Skinless chicken or turkey. Ground chicken or turkey. Pork with fat trimmed off. Fish and seafood. Egg whites. Dried beans, peas, or lentils. Unsalted nuts, nut butters, and seeds. Unsalted canned beans. Lean cuts of beef with fat trimmed off. Low-sodium, lean deli meat. Dairy Low-fat (1%) or fat-free (skim) milk. Fat-free, low-fat, or reduced-fat cheeses. Nonfat, low-sodium ricotta or cottage cheese. Low-fat or nonfat yogurt. Low-fat, low-sodium cheese. Fats and oils Soft margarine without trans fats. Vegetable oil. Low-fat, reduced-fat, or light mayonnaise and salad dressings (reduced-sodium). Canola, safflower, olive, soybean, and sunflower oils. Avocado. Seasoning and other foods Herbs. Spices. Seasoning mixes without salt. Unsalted popcorn and pretzels. Fat-free sweets. What foods are not recommended? The items listed may not be a complete list. Talk with your dietitian about what dietary choices are best for you. Grains Baked goods made with fat, such as croissants, muffins, or some breads. Dry pasta or rice meal packs. Vegetables Creamed or fried vegetables. Vegetables in a cheese sauce. Regular canned vegetables (not low-sodium or reduced-sodium). Regular canned tomato sauce and paste (not low-sodium or reduced-sodium). Regular tomato and vegetable juice (not low-sodium or reduced-sodium). Pickles. Olives. Fruits Canned fruit in a light or heavy syrup. Fried fruit. Fruit in cream or butter sauce. Meat and other protein foods Fatty cuts of meat. Ribs. Fried meat. Bacon. Sausage. Bologna and other processed lunch meats. Salami. Fatback. Hotdogs. Bratwurst. Salted nuts and seeds. Canned beans with added salt. Canned or smoked fish. Whole eggs or egg yolks. Chicken or turkey with skin. Dairy Whole or 2% milk, cream, and half-and-half. Whole or full-fat cream cheese. Whole-fat or sweetened yogurt. Full-fat cheese. Nondairy creamers. Whipped toppings.  Processed cheese and cheese spreads. Fats and oils Butter. Stick margarine. Lard. Shortening. Ghee. Bacon fat. Tropical oils, such as coconut, palm kernel, or palm oil. Seasoning and other foods Salted popcorn and pretzels. Onion salt, garlic salt, seasoned salt, table salt, and sea salt. Worcestershire sauce. Tartar sauce. Barbecue sauce. Teriyaki sauce. Soy sauce, including reduced-sodium. Steak sauce. Canned and packaged gravies. Fish sauce. Oyster sauce. Cocktail sauce. Horseradish that you find on the shelf. Ketchup. Mustard. Meat flavorings and tenderizers. Bouillon cubes. Hot sauce and Tabasco sauce. Premade or packaged marinades. Premade or packaged taco seasonings. Relishes. Regular salad dressings. Where to find more information:  National Heart, Lung, and Blood Institute: www.nhlbi.nih.gov  American Heart Association: www.heart.org Summary  The DASH eating plan is a healthy eating plan that has been shown to reduce high blood pressure (hypertension). It may also reduce your risk for type 2 diabetes, heart disease, and stroke.  With the DASH eating plan, you should limit salt (sodium) intake to 2,300 mg a day. If you have hypertension, you may need to reduce your sodium intake to 1,500 mg a day.  When on the DASH eating plan, aim to eat more fresh fruits and vegetables, whole grains, lean proteins, low-fat dairy, and heart-healthy fats.  Work with your health care provider or diet and nutrition specialist (dietitian) to adjust your eating plan to your individual   calorie needs. This information is not intended to replace advice given to you by your health care provider. Make sure you discuss any questions you have with your health care provider. Document Released: 12/07/2010 Document Revised: 12/12/2015 Document Reviewed: 12/12/2015 Elsevier Interactive Patient Education  2017 Elsevier Inc.   Please check your blood pressure on a regular basis.  If it is consistently greater than  140/90, please make an office appointment.    It is important that you exercise regularly, at least 20 minutes 3 to 4 times per week.  If you develop chest pain or shortness of breath seek  medical attention.  Call or return to clinic prn if these symptoms worsen or fail to improve as anticipated.

## 2016-11-19 ENCOUNTER — Ambulatory Visit
Admission: RE | Admit: 2016-11-19 | Discharge: 2016-11-19 | Disposition: A | Discharge status: Home / Self Care | Payer: 59 | Source: Ambulatory Visit | Attending: Internal Medicine | Admitting: Internal Medicine

## 2017-01-14 ENCOUNTER — Other Ambulatory Visit: Payer: Self-pay | Admitting: Internal Medicine

## 2017-01-14 ENCOUNTER — Encounter: Payer: Self-pay | Admitting: Internal Medicine

## 2017-01-14 DIAGNOSIS — R1013 Epigastric pain: Secondary | ICD-10-CM

## 2017-01-15 ENCOUNTER — Encounter: Payer: Self-pay | Admitting: Gastroenterology

## 2017-01-24 ENCOUNTER — Encounter: Payer: Self-pay | Admitting: Gastroenterology

## 2017-01-24 ENCOUNTER — Ambulatory Visit (INDEPENDENT_AMBULATORY_CARE_PROVIDER_SITE_OTHER): Payer: 59 | Admitting: Gastroenterology

## 2017-01-24 ENCOUNTER — Other Ambulatory Visit (INDEPENDENT_AMBULATORY_CARE_PROVIDER_SITE_OTHER): Payer: 59

## 2017-01-24 VITALS — BP 152/94 | HR 88 | Ht 76.5 in | Wt 242.8 lb

## 2017-01-24 DIAGNOSIS — K760 Fatty (change of) liver, not elsewhere classified: Secondary | ICD-10-CM

## 2017-01-24 DIAGNOSIS — R1013 Epigastric pain: Secondary | ICD-10-CM | POA: Diagnosis not present

## 2017-01-24 LAB — COMPREHENSIVE METABOLIC PANEL
ALBUMIN: 4.3 g/dL (ref 3.5–5.2)
ALK PHOS: 40 U/L (ref 39–117)
ALT: 40 U/L (ref 0–53)
AST: 31 U/L (ref 0–37)
BUN: 12 mg/dL (ref 6–23)
CALCIUM: 9.9 mg/dL (ref 8.4–10.5)
CHLORIDE: 104 meq/L (ref 96–112)
CO2: 30 mEq/L (ref 19–32)
CREATININE: 1.02 mg/dL (ref 0.40–1.50)
GFR: 87.48 mL/min (ref >60.00)
Glucose, Bld: 102 mg/dL — ABNORMAL HIGH (ref 70–99)
POTASSIUM: 4.2 meq/L (ref 3.5–5.1)
SODIUM: 141 meq/L (ref 135–145)
TOTAL PROTEIN: 7 g/dL (ref 6.0–8.3)
Total Bilirubin: 0.7 mg/dL (ref 0.2–1.2)

## 2017-01-24 LAB — CBC WITH DIFFERENTIAL/PLATELET
BASOS ABS: 0 10*3/uL (ref 0.0–0.1)
BASOS PCT: 0.2 % (ref 0.0–3.0)
EOS ABS: 0.2 10*3/uL (ref 0.0–0.7)
Eosinophils Relative: 4.8 % (ref 0.0–5.0)
HEMATOCRIT: 48.2 % (ref 39.0–52.0)
HEMOGLOBIN: 16.7 g/dL (ref 13.0–17.0)
LYMPHS PCT: 24.4 % (ref 12.0–46.0)
Lymphs Abs: 1.1 10*3/uL (ref 0.7–4.0)
MCHC: 34.7 g/dL (ref 30.0–36.0)
MCV: 88.1 fl (ref 78.0–100.0)
MONO ABS: 0.4 10*3/uL (ref 0.1–1.0)
Monocytes Relative: 9.5 % (ref 3.0–12.0)
Neutro Abs: 2.8 10*3/uL (ref 1.4–7.7)
Neutrophils Relative %: 61.1 % (ref 43.0–77.0)
Platelets: 193 10*3/uL (ref 150.0–400.0)
RBC: 5.47 Mil/uL (ref 4.22–5.81)
RDW: 12.6 % (ref 11.5–15.5)
WBC: 4.6 10*3/uL (ref 4.0–10.5)

## 2017-01-24 LAB — LIPASE: Lipase: 19 U/L (ref 11.0–59.0)

## 2017-01-24 LAB — H. PYLORI ANTIBODY, IGG: H Pylori IgG: NEGATIVE

## 2017-01-24 NOTE — Progress Notes (Signed)
HPI :  37 y/o male without any significant medical history referred for epigastric pain by Dr. Eleonore ChiquitoPeter Kwiatkowski.   He states his pain has been ongoing since September 2018. His discomfort is mostly in epigastric area. It does not radiate anywhere else, does not radiate to the shoulder or back. He feels it most days of the week, roughly more than half of the days of the week. He describes it as pressure, which is annoying and mild. He states that when he lies down he has no discomfort, however when he sits forward or straight up a reproduces his pain. He states he travels a lot for work, he states his symptoms are most bothersome when he is not eating well. He also states stress makes things worse. He has tried a gluten-free diet which is not helped at all. He has tried cutting out alcohol which has not helped. While he states when he does not he eat healthy he feels worse, he denies any clear prandial relationship with his symptoms. Pain can last for upwards of an entire day at a time before going away. He denies any changes in his bowel habits. He denies any constipation or diarrhea. He denies any nausea or vomiting. He denies any weight loss. He denies dysphagia. He denies any blood in the stools. He was given Prilosec to take once per day and he states this did not help. He denies any reflux or heartburn at baseline. He did have an ultrasound of his abdomen in November which did not show any gallstones. It did show fat in the liver however. He does say he previously drank about 3 alcoholic beverages per day in [id number], since the new year he has tried to cut back in has anywhere from none to 1 drink per day. He denies any family history of cancers in first-degree relatives. He denies that exercising her working out make his symptoms worse. He takes occasional ibuprofen but not routinely.  US 11/19/2016 - no gallstones, normal gallbladder, fatty liver   Past Medical History:  Diagnosis Date  . History  of chicken pox      Past Surgical History:  Procedure Laterality Date  . HERNIA REPAIR     as a child- as surgery performed, dr realized no hernia present   Family History  Problem Relation Age of Onset  . Hyperlipidemia Mother   . Ovarian cancer Maternal Grandmother   . Hyperlipidemia Paternal Grandmother   . Arthritis Paternal Grandmother   . Heart disease Paternal Grandmother   . Breast cancer Paternal Grandmother   . Colon cancer Neg Hx   . Esophageal cancer Neg Hx    Social History   Tobacco Use  . Smoking status: Never Smoker  . Smokeless tobacco: Never Used  Substance Use Topics  . Alcohol use: Yes    Comment: has less than 1 drink as of 2019  . Drug use: No   Current Outpatient Medications  Medication Sig Dispense Refill  . ibuprofen (ADVIL,MOTRIN) 200 MG tablet Take 400 mg by mouth as needed.     No current facility-administered medications for this visit.    No Known Allergies   Review of Systems: All systems reviewed and negative except where noted in HPI.    No results found.  Physical Exam: BP (!) 152/94   Pulse 88   Ht 6' 4.5" (1.943 m)   Wt 242 lb 12.8 oz (110.1 kg)   BMI 29.17 kg/m  Constitutional: Pleasant,well-developed, male in no acute  distress. HEENT: Normocephalic and atraumatic. Conjunctivae are normal. No scleral icterus. Neck supple.  Cardiovascular: Normal rate, regular rhythm.  Pulmonary/chest: Effort normal and breath sounds normal. No wheezing, rales or rhonchi. Abdominal: Soft, nondistended, nontender. Negative Carnett. There are no masses palpable. No hepatomegaly. Extremities: no edema Lymphadenopathy: No cervical adenopathy noted. Neurological: Alert and oriented to person place and time. Skin: Skin is warm and dry. No rashes noted. Psychiatric: Normal mood and affect. Behavior is normal.   ASSESSMENT AND PLAN: 37 year old male, presenting with new onset upper abdominal discomfort since September. History as outlined  above, he denies any clear prandial relationship with the symptoms, lasts upwards of a day at a time, and is somewhat positional but I can't elicit it on exam. I discussed ddx with him. GERD / PUD seems unlikely, as he has had no benefit with PPI at all. H pylori is possible but I think less likely, while his symptoms are not c/w biliary colic and [place] did not show gallstones. Musculoskeletal pain is possible given positional component. Pancreatic pathology also possible. He has no baseline labs I can see today, will check CBC, CMET, lipase, and H pylori IgG. If these are negative and pain persists, will recommend CT scan of the abdomen.   Regarding fatty liver, I counseled him on this, likely due to alcohol use. Will see if his LFTs are elevated from this. Recommend he cut back on alcohol intake and stop altogether if LFTs are elevated.   He agreed with the plan, will await labs initially. All questions answered.   Ileene Patrick, MD [facility] Pager [phone]  CC: Gordy Savers, MD

## 2017-01-24 NOTE — Patient Instructions (Addendum)
If you are age 37 or older, your body mass index should be between 23-30. Your Body mass index is 29.17 kg/m. If this is out of the aforementioned range listed, please consider follow up with your Primary Care Provider.  If you are age 37 or younger, your body mass index should be between 19-25. Your Body mass index is 29.17 kg/m. If this is out of the aformentioned range listed, please consider follow up with your Primary Care Provider.   Please go to the lab in the basement of our building to have lab work done as you leave today.   Thank you for entrusting me with your care and for choosing [facility], Dr. Ileene PatrickSteven Armbruster

## 2017-01-28 ENCOUNTER — Other Ambulatory Visit: Payer: Self-pay

## 2017-01-28 DIAGNOSIS — R1013 Epigastric pain: Secondary | ICD-10-CM

## 2017-01-29 ENCOUNTER — Telehealth: Payer: Self-pay

## 2017-01-29 NOTE — Telephone Encounter (Signed)
Pt scheduled for CT at [facility]  on Thursday, 01-31-17 at 11:15am. To arrive at 11:00am. Nothing to eat after 7:15am (4 hours prior to test) Pt to pick up 2 bottles of oral contrast. To drink 1 bottle of contrast @ 9:15am (2 hours prior to exam)  And to drink 1 bottle of contrast @ 10:15am (1 hour prior to exam).  LM for pt to call back and will need to pick up contrast.

## 2017-01-29 NOTE — Telephone Encounter (Signed)
Called and spoke to pt.  Gave him instructions for CT.  Told him I would Leave 2 bottles of contrast and  printed instructed at the front desk.  Pt expressed understanding.

## 2017-01-29 NOTE — Progress Notes (Signed)
Pt scheduled for CT at [place] [facility]  on Thursday, 01-31-17 at 11:15am. To arrive at 11:00am. Nothing to eat after 7:15am (4 hours prior to test) Pt to pick up 2 bottles of oral contrast. To drink 1 bottle of contrast @ 9:15am (2 hours prior to exam)  And to drink 1 bottle of contrast @ 10:15am (1 hour prior to exam).  LM for pt to call back and will need to pick up contrast.    

## 2017-01-31 ENCOUNTER — Inpatient Hospital Stay: Admission: RE | Admit: 2017-01-31 | Payer: 59 | Source: Ambulatory Visit

## 2017-02-19 ENCOUNTER — Ambulatory Visit (INDEPENDENT_AMBULATORY_CARE_PROVIDER_SITE_OTHER)
Admission: RE | Admit: 2017-02-19 | Discharge: 2017-02-19 | Disposition: A | Discharge status: Home / Self Care | Payer: 59 | Source: Ambulatory Visit | Attending: Gastroenterology | Admitting: Gastroenterology

## 2017-02-19 DIAGNOSIS — R1013 Epigastric pain: Secondary | ICD-10-CM | POA: Diagnosis not present

## 2017-02-19 MED ORDER — IOPAMIDOL (ISOVUE-300) INJECTION 61%
80.0000 mL | Freq: Once | INTRAVENOUS | Status: AC | PRN
Start: 2017-02-19 — End: 2017-02-19
  Administered 2017-02-19: 80 mL via INTRAVENOUS

## 2017-02-25 ENCOUNTER — Other Ambulatory Visit: Payer: Self-pay

## 2017-02-25 ENCOUNTER — Ambulatory Visit (AMBULATORY_SURGERY_CENTER): Payer: Self-pay | Admitting: *Deleted

## 2017-02-25 ENCOUNTER — Encounter: Payer: Self-pay | Admitting: Gastroenterology

## 2017-02-25 VITALS — Ht 77.75 in | Wt 239.8 lb

## 2017-02-25 DIAGNOSIS — R1013 Epigastric pain: Secondary | ICD-10-CM

## 2017-02-25 NOTE — Progress Notes (Signed)
No egg or soy allergy known to patient  No issues with past sedation with any surgeries  or procedures, no intubation problems  No diet pills per patient No home 02 use per patient  No blood thinners per patient  Pt denies issues with constipation  No A fib or A flutter  In past  EMMI video sent to pt's e mail

## 2017-03-04 ENCOUNTER — Encounter: Payer: 59 | Admitting: Gastroenterology

## 2017-03-04 ENCOUNTER — Other Ambulatory Visit: Payer: Self-pay

## 2017-03-04 ENCOUNTER — Encounter: Payer: Self-pay | Admitting: Gastroenterology

## 2017-03-04 ENCOUNTER — Ambulatory Visit (AMBULATORY_SURGERY_CENTER): Payer: 59 | Admitting: Gastroenterology

## 2017-03-04 VITALS — BP 126/78 | HR 73 | Temp 98.6°F | Resp 13 | Ht 77.75 in | Wt 239.8 lb

## 2017-03-04 DIAGNOSIS — R1013 Epigastric pain: Secondary | ICD-10-CM | POA: Diagnosis present

## 2017-03-04 DIAGNOSIS — K317 Polyp of stomach and duodenum: Secondary | ICD-10-CM

## 2017-03-04 MED ORDER — SODIUM CHLORIDE 0.9 % IV SOLN: 500.0000 mL | Freq: Once | INTRAVENOUS | Status: AC

## 2017-03-04 NOTE — Progress Notes (Signed)
Pt's states no medical or surgical changes since previsit or office visit. 

## 2017-03-04 NOTE — Progress Notes (Signed)
Report given to PACU, vss 

## 2017-03-04 NOTE — Op Note (Signed)
[facility] Patient Name: Lance Jordan Procedure Date: 03/04/2017 10:14 AM MRN: [id number] Endoscopist: Viviann SpareSteven P. Armbruster MD, MD Age: 37 Referring MD:  Date of Birth: 05/20/1980 Gender: Male Account #: [id number] Procedure:                Upper GI endoscopy Indications:              Epigastric abdominal pain, negative CT scan and US Medicines:                Monitored Anesthesia Care Procedure:                Pre-Anesthesia Assessment:                           - Prior to the procedure, a History and Physical                            was performed, and patient medications and                            allergies were reviewed. The patient's tolerance of                            previous anesthesia was also reviewed. The risks                            and benefits of the procedure and the sedation                            options and risks were discussed with the patient.                            All questions were answered, and informed consent                            was obtained. Prior Anticoagulants: The patient has                            taken no previous anticoagulant or antiplatelet                            agents. ASA Grade Assessment: I - A normal, healthy                            patient. After reviewing the risks and benefits,                            the patient was deemed in satisfactory condition to                            undergo the procedure.                           After obtaining informed consent, the endoscope was  passed under direct vision. Throughout the                            procedure, the patient's blood pressure, pulse, and                            oxygen saturations were monitored continuously. The                            Endoscope was introduced through the mouth, and                            advanced to the second part of duodenum. The upper                            GI endoscopy  was accomplished without difficulty.                            The patient tolerated the procedure well. Scope In: Scope Out: 10:24:53 AM Findings:                 Esophagogastric landmarks were identified: the                            Z-line was found at 43 cm, the gastroesophageal                            junction was found at 43 cm and the upper extent of                            the gastric folds was found at 44 cm from the                            incisors.                           A 1 cm hiatal hernia was present.                           The exam of the esophagus was otherwise normal.                           A single 4 to 5 mm benign appearing sessile polyp                            was found in the gastric fundus. Biopsies were                            taken with a cold forceps for histology.                           The exam of the stomach was otherwise normal.  Biopsies were taken with a cold forceps in the                            gastric body, at the incisura and in the gastric                            antrum for Helicobacter pylori testing.                           The duodenal bulb and second portion of the                            duodenum were normal. Complications:            No immediate complications. Estimated blood loss:                            Minimal. Estimated Blood Loss:     Estimated blood loss was minimal. Impression:               - Esophagogastric landmarks identified.                           - 1 cm hiatal hernia.                           - Normal esophagus otherwise.                           - A single benign appearing small gastric polyp.                            Biopsied.                           - Normal stomach otherwise - biopsies taken to rule                            out H pylori                           - Normal duodenal bulb and second portion of the                             duodenum.                           No overt pathology noted on EGD to cause symptoms,                            although will await biopsies to rule out H pylori.                            Pain could be musculoskeletal in etiology given  negative imaging to date. Recommendation:           - Patient has a contact number available for                            emergencies. The signs and symptoms of potential                            delayed complications were discussed with the                            patient. Return to normal activities tomorrow.                            Written discharge instructions were provided to the                            patient.                           - Resume previous diet.                           - Continue present medications.                           - Await pathology results with further                            recommendations. Viviann Spare P. Armbruster MD, MD 03/04/2017 10:30:14 AM This report has been signed electronically.

## 2017-03-04 NOTE — Patient Instructions (Signed)
Discharge instructions given. Handout on hiatal hernia. Resume previous medications. YOU HAD AN ENDOSCOPIC PROCEDURE TODAY AT THE [facility]:   Refer to the procedure report that was given to you for any specific questions about what was found during the examination.  If the procedure report does not answer your questions, please call your gastroenterologist to clarify.  If you requested that your care partner not be given the details of your procedure findings, then the procedure report has been included in a sealed envelope for you to review at your convenience later.  YOU SHOULD EXPECT: Some feelings of bloating in the abdomen. Passage of more gas than usual.  Walking can help get rid of the air that was put into your GI tract during the procedure and reduce the bloating. If you had a lower endoscopy (such as a colonoscopy or flexible sigmoidoscopy) you may notice spotting of blood in your stool or on the toilet paper. If you underwent a bowel prep for your procedure, you may not have a normal bowel movement for a few days.  Please Note:  You might notice some irritation and congestion in your nose or some drainage.  This is from the oxygen used during your procedure.  There is no need for concern and it should clear up in a day or so.  SYMPTOMS TO REPORT IMMEDIATELY:    Following upper endoscopy (EGD)  Vomiting of blood or coffee ground material  New chest pain or pain under the shoulder blades  Painful or persistently difficult swallowing  New shortness of breath  Fever of 100F or higher  Black, tarry-looking stools  For urgent or emergent issues, a gastroenterologist can be reached at any hour by calling [phone].   DIET:  We do recommend a small meal at first, but then you may proceed to your regular diet.  Drink plenty of fluids but you should avoid alcoholic beverages for 24 hours.  ACTIVITY:  You should plan to take it easy for the rest of today and you should  NOT DRIVE or use heavy machinery until tomorrow (because of the sedation medicines used during the test).    FOLLOW UP: Our staff will call the number listed on your records the next business day following your procedure to check on you and address any questions or concerns that you may have regarding the information given to you following your procedure. If we do not reach you, we will leave a message.  However, if you are feeling well and you are not experiencing any problems, there is no need to return our call.  We will assume that you have returned to your regular daily activities without incident.  If any biopsies were taken you will be contacted by phone or by letter within the next 1-3 weeks.  Please call us at [phone] if you have not heard about the biopsies in 3 weeks.    SIGNATURES/CONFIDENTIALITY: You and/or your care partner have signed paperwork which will be entered into your electronic medical record.  These signatures attest to the fact that that the information above on your After Visit Summary has been reviewed and is understood.  Full responsibility of the confidentiality of this discharge information lies with you and/or your care-partner.

## 2017-03-04 NOTE — Progress Notes (Signed)
Called to room to assist during endoscopic procedure.  Patient ID and intended procedure confirmed with present staff. Received instructions for my participation in the procedure from the performing physician.  

## 2017-03-05 ENCOUNTER — Telehealth: Payer: Self-pay | Admitting: *Deleted

## 2017-03-05 ENCOUNTER — Telehealth: Payer: Self-pay

## 2017-03-05 NOTE — Telephone Encounter (Signed)
  Follow up Call-  Call back number 03/04/2017  Post procedure Call Back phone  # [phone]  Permission to leave phone message Yes  Some recent data might be hidden     Left message

## 2017-03-05 NOTE — Telephone Encounter (Signed)
No answer for second post procedure call back. Left message for patient to call with questions or concerns. SM 

## 2017-03-11 ENCOUNTER — Telehealth: Payer: Self-pay | Admitting: Gastroenterology

## 2017-03-11 NOTE — Telephone Encounter (Signed)
Patient returning call about path results from 3.11.19.

## 2017-03-11 NOTE — Telephone Encounter (Signed)
Again attempted to call patient but went straight to voicemail. Left another voicemail that he can call back tomorrow morning.

## 2017-03-12 ENCOUNTER — Telehealth: Payer: Self-pay

## 2017-03-12 NOTE — Telephone Encounter (Signed)
After multiple attempts to reach pt were unsuccessful a Letter sent to pt with results of EGD and recommendations.

## 2017-03-12 NOTE — Telephone Encounter (Signed)
Thanks Jan 

## 2017-05-20 ENCOUNTER — Ambulatory Visit (INDEPENDENT_AMBULATORY_CARE_PROVIDER_SITE_OTHER): Payer: 59 | Admitting: Internal Medicine

## 2017-05-20 ENCOUNTER — Encounter: Payer: Self-pay | Admitting: Internal Medicine

## 2017-05-20 ENCOUNTER — Ambulatory Visit: Payer: 59 | Admitting: Internal Medicine

## 2017-05-20 VITALS — BP 126/82 | HR 81 | Temp 98.0°F | Resp 16 | Ht 77.75 in | Wt 234.4 lb

## 2017-05-20 DIAGNOSIS — L923 Foreign body granuloma of the skin and subcutaneous tissue: Secondary | ICD-10-CM | POA: Diagnosis not present

## 2017-05-20 DIAGNOSIS — Z3009 Encounter for other general counseling and advice on contraception: Secondary | ICD-10-CM

## 2017-05-20 NOTE — Progress Notes (Signed)
Subjective:    Patient ID: Lance Jordan, male    DOB: June 08, 1980, 37 y.o.   MRN: [id number]  HPI  37 year old patient who presents with a nodule involving his right thumb area.  He was working with a coffee pot a couple of weeks ago and feels that he sustained a injury and possible foreign body to the area.  The nodule has been slightly tender   he is requesting referral to urology for vasectomy   Past Medical History:  Diagnosis Date  . Heart murmur    as a child  . History of chicken pox      Social History   Socioeconomic History  . Marital status: Married    Spouse name: Not on file  . Number of children: Not on file  . Years of education: Not on file  . Highest education level: Not on file  Occupational History  . Not on file  Social Needs  . Financial resource strain: Not on file  . Food insecurity:    Worry: Not on file    Inability: Not on file  . Transportation needs:    Medical: Not on file    Non-medical: Not on file  Tobacco Use  . Smoking status: Never Smoker  . Smokeless tobacco: Never Used  Substance and Sexual Activity  . Alcohol use: Yes    Comment: has less than 1 drink as of 2019   . Drug use: No  . Sexual activity: Not on file  Lifestyle  . Physical activity:    Days per week: Not on file    Minutes per session: Not on file  . Stress: Not on file  Relationships  . Social connections:    Talks on phone: Not on file    Gets together: Not on file    Attends religious service: Not on file    Active member of club or organization: Not on file    Attends meetings of clubs or organizations: Not on file    Relationship status: Not on file  . Intimate partner violence:    Fear of current or ex partner: Not on file    Emotionally abused: Not on file    Physically abused: Not on file    Forced sexual activity: Not on file  Other Topics Concern  . Not on file  Social History Narrative  . Not on file    Past Surgical History:  Procedure  Laterality Date  . HERNIA REPAIR     as a child- as surgery performed, dr realized no hernia present    Family History  Problem Relation Age of Onset  . Hyperlipidemia Mother   . Ovarian cancer Maternal Grandmother   . Hyperlipidemia Paternal Grandmother   . Arthritis Paternal Grandmother   . Heart disease Paternal Grandmother   . Breast cancer Paternal Grandmother   . Colon cancer Neg Hx   . Esophageal cancer Neg Hx   . Colon polyps Neg Hx   . Stomach cancer Neg Hx   . Rectal cancer Neg Hx     No Known Allergies  Current Outpatient Medications on File Prior to Visit  Medication Sig Dispense Refill  . ibuprofen (ADVIL,MOTRIN) 200 MG tablet Take 400 mg by mouth as needed.     Current Facility-Administered Medications on File Prior to Visit  Medication Dose Route Frequency Provider Last Rate Last Dose  . 0.9 %  sodium chloride infusion  500 mL Intravenous Once Armbruster, Willaim Rayas, MD  BP 126/82   Pulse 81   Temp 98 F (36.7 C)   Resp 16   Ht 6' 5.75" (1.975 m)   Wt 234 lb 6 oz (106.3 kg)   SpO2 100%   BMI 27.26 kg/m    Review of Systems  Constitutional: Negative.   Skin: Positive for wound.       Objective:   Physical Exam  Constitutional: He appears well-developed and well-nourished. No distress.  Skin:  8 mm minimally raised circular papule involving the right thumb area.  The tissue was firm indurated and slightly tender to palpation.  No obvious foreign body could be appreciated          Assessment & Plan:   Probable foreign body reaction papule right distal thumb.  After topical anesthesia and local anesthesia with 1% Xylocaine without epinephrine a small incision was made through the central papule area.  The area was explored.  No obvious foreign body recovered  Local wound care discussed.  Patient will soak his right hand over the next 1 to 2 days.  Apply local antibiotic ointment and wrap  Will consider referral to a hand surgeon if  lesion does not respond to today's I&D.  Rogelia Boga

## 2017-05-20 NOTE — Patient Instructions (Addendum)
Call or return to clinic prn if these symptoms worsen or fail to improve as anticipated.   Skin Foreign Body A skin foreign body is an object that is stuck in the skin. Common objects that get stuck in the skin include:  Wood (splinter).  Glass.  Rock.  Nails.  Needles.  Thorns or cactus spines.  Fiberglass slivers.  Fish hooks.  BBs.  Foreign bodies may damage tissue or cause infection. If the foreign body does not cause any pain or infection, it may be okay to leave it in the skin. What are the causes? This condition is caused by an object getting lodged under the skin, usually by accident. Children may get a skin foreign body while playing outside. Adults may get a skin foreign body after breaking glass or while working with wood, fiberglass, or stone material. In some cases, the object may get stuck in an open wound after an injury. What are the signs or symptoms? Symptoms of this condition include:  Pain.  A feeling of something being stuck under the skin.  How is this diagnosed? This condition is diagnosed based on:  Your medical history and symptoms.  A physical exam.  Imaging tests, such as: ? X-rays. ? CT scans. ? Ultrasounds.  How is this treated? Treatment for this condition depends on what the foreign body is, where it is, and whether it is causing infection or other symptoms. Treatment may involve:  Removing all or part of the object with a needle and metal tweezers. In some cases, an incision may be made in the skin to allow access to the object.  Waiting to remove the object until it moves closer to the surface of the skin. This may take several days.  Leaving the object in place. This may be done if the object is not causing any symptoms or if removal will cause more damage to the skin or tissue.  Antibiotic pills or ointment to treat or prevent infection.  Follow these instructions at home: Wound or incision care  If the foreign body was  removed, follow instructions from your health care provider about how to take care of your wound or incision. Make sure you: ? Wash your hands with soap and water before you change your bandage (dressing). If soap and water are not available, use hand sanitizer. ? Change your dressing as told by your health care provider. ? Leave stitches (sutures), skin glue, or adhesive strips in place. These skin closures may need to stay in place for 2 weeks or longer. If adhesive strip edges start to loosen and curl up, you may trim the loose edges. Do not remove adhesive strips completely unless your health care provider tells you to do that.  Check your wound or incision every day for signs of infection. This is especially important if the foreign body was left in place in the skin. Check for: ? Redness, swelling, or pain. ? Fluid or blood. ? Pus or a bad smell. ? Warmth. General instructions  Take over-the-counter and prescription medicines only as told by your health care provider.  If you were prescribed an antibiotic medicine or ointment, use it as told by your health care provider. Do not stop using the antibiotic even if you start to feel better.  Keep all follow-up visits as told by your health care provider. This is important. Contact a health care provider if:  You develop more pain or other new symptoms around the area where the object entered  the skin.  You have redness, swelling, or pain around your wound or incision.  You have fluid or blood coming from your wound or incision.  Your wound or incision feels warm to the touch.  You have pus or a bad smell coming from your wound or incision.  You have a fever. Get help right away if:  You have severe pain that does not get better with medicine. Summary  A skin foreign body is an object that is stuck in the skin. Common objects that get stuck in the skin include wood, glass, rock, thorns, and fiberglass slivers.  Treatment for  this condition depends on what the foreign body is, where it is, and whether it is causing infection or other symptoms.  Treatment may include removing the foreign body or leaving it in place. It is important to watch the wound or incision for signs of infection, especially if the object was left in place in the skin. This information is not intended to replace advice given to you by your health care provider. Make sure you discuss any questions you have with your health care provider. Document Released: 03/21/2016 Document Revised: 03/21/2016 Document Reviewed: 03/21/2016 Elsevier Interactive Patient Education  [employer].

## 2017-05-29 ENCOUNTER — Encounter: Payer: Self-pay | Admitting: Internal Medicine

## 2017-05-31 ENCOUNTER — Other Ambulatory Visit: Payer: Self-pay | Admitting: Internal Medicine

## 2017-05-31 DIAGNOSIS — Z3009 Encounter for other general counseling and advice on contraception: Secondary | ICD-10-CM

## 2017-05-31 DIAGNOSIS — M795 Residual foreign body in soft tissue: Secondary | ICD-10-CM

## 2017-06-18 ENCOUNTER — Other Ambulatory Visit: Payer: Self-pay | Admitting: Orthopedic Surgery

## 2017-06-21 ENCOUNTER — Encounter: Payer: Self-pay | Admitting: Internal Medicine

## 2017-07-29 ENCOUNTER — Ambulatory Visit (HOSPITAL_BASED_OUTPATIENT_CLINIC_OR_DEPARTMENT_OTHER): Admit: 2017-07-29 | Payer: 59 | Admitting: Orthopedic Surgery

## 2017-07-29 ENCOUNTER — Encounter (HOSPITAL_BASED_OUTPATIENT_CLINIC_OR_DEPARTMENT_OTHER): Payer: Self-pay

## 2017-07-29 SURGERY — EXCISION MASS UPPER EXTREMITIES
Anesthesia: Choice | Laterality: Right

## 2018-05-15 ENCOUNTER — Ambulatory Visit (INDEPENDENT_AMBULATORY_CARE_PROVIDER_SITE_OTHER): Payer: 59 | Admitting: Internal Medicine

## 2018-05-15 ENCOUNTER — Encounter: Payer: Self-pay | Admitting: Internal Medicine

## 2018-05-15 ENCOUNTER — Other Ambulatory Visit: Payer: Self-pay

## 2018-05-15 DIAGNOSIS — Z8679 Personal history of other diseases of the circulatory system: Secondary | ICD-10-CM

## 2018-05-15 NOTE — Progress Notes (Signed)
Virtual Visit via Video Note  I connected with Lance Jordan on 05/15/18 at  1:00 PM EDT by a video enabled telemedicine application and verified that I am speaking with the correct person using two identifiers.  Location patient: home Location provider: work office Persons participating in the virtual visit: patient, provider  I discussed the limitations of evaluation and management by telemedicine and the availability of in person appointments. The patient expressed understanding and agreed to proceed.   HPI: This visit is to establish care. He has no PMH of significance other than an occasional episode of perceived palpitations. All EKGs have been normal.  He is married, has 3 children ages 659,7 and 659 mo old. He is an Acupuncturistelectrical engineer. Never smoker, drinks 2-3 beers or glasses of wine every night.  He has no acute complaints.   ROS: Constitutional: Denies fever, chills, diaphoresis, appetite change and fatigue.  HEENT: Denies photophobia, eye pain, redness, hearing loss, ear pain, congestion, sore throat, rhinorrhea, sneezing, mouth sores, trouble swallowing, neck pain, neck stiffness and tinnitus.   Respiratory: Denies SOB, DOE, cough, chest tightness,  and wheezing.   Cardiovascular: Denies chest pain, palpitations and leg swelling.  Gastrointestinal: Denies nausea, vomiting, abdominal pain, diarrhea, constipation, blood in stool and abdominal distention.  Genitourinary: Denies dysuria, urgency, frequency, hematuria, flank pain and difficulty urinating.  Endocrine: Denies: hot or cold intolerance, sweats, changes in hair or nails, polyuria, polydipsia. Musculoskeletal: Denies myalgias, back pain, joint swelling, arthralgias and gait problem.  Skin: Denies pallor, rash and wound.  Neurological: Denies dizziness, seizures, syncope, weakness, light-headedness, numbness and headaches.  Hematological: Denies adenopathy. Easy bruising, personal or family bleeding history   Psychiatric/Behavioral: Denies suicidal ideation, mood changes, confusion, nervousness, sleep disturbance and agitation   Past Medical History:  Diagnosis Date  . Heart murmur    as a child  . History of chicken pox     Past Surgical History:  Procedure Laterality Date  . HERNIA REPAIR     as a child- as surgery performed, dr realized no hernia present    Family History  Problem Relation Age of Onset  . Hyperlipidemia Mother   . Ovarian cancer Maternal Grandmother   . Hyperlipidemia Paternal Grandmother   . Arthritis Paternal Grandmother   . Heart disease Paternal Grandmother   . Breast cancer Paternal Grandmother   . Colon cancer Neg Hx   . Esophageal cancer Neg Hx   . Colon polyps Neg Hx   . Stomach cancer Neg Hx   . Rectal cancer Neg Hx     SOCIAL HX:   reports that he has never smoked. He has never used smokeless tobacco. He reports current alcohol use. He reports that he does not use drugs.   Current Outpatient Medications:  .  ibuprofen (ADVIL,MOTRIN) 200 MG tablet, Take 400 mg by mouth as needed., Disp: , Rfl:   Current Facility-Administered Medications:  .  0.9 %  sodium chloride infusion, 500 mL, Intravenous, Once, Armbruster, Willaim RayasSteven P, MD  EXAM:   VITALS per patient if applicable: none reported  GENERAL: alert, oriented, appears well and in no acute distress  HEENT: atraumatic, conjunttiva clear, no obvious abnormalities on inspection of external nose and ears  NECK: normal movements of the head and neck  LUNGS: on inspection no signs of respiratory distress, breathing rate appears normal, no obvious gross increased work of breathing, gasping or wheezing  CV: no obvious cyanosis  MS: moves all visible extremities without noticeable abnormality  PSYCH/NEURO: pleasant and cooperative, no obvious depression or anxiety, speech and thought processing grossly intact  ASSESSMENT AND PLAN:   History of irregular heartbeat -Happens about once a month;  could consider 30 day event monitor if really causing symptoms, otherwise observe for now.  Needs to schedule follow up for CPE.    I discussed the assessment and treatment plan with the patient. The patient was provided an opportunity to ask questions and all were answered. The patient agreed with the plan and demonstrated an understanding of the instructions.   The patient was advised to call back or seek an in-person evaluation if the symptoms worsen or if the condition fails to improve as anticipated.    Chaya Jan, MD  [facility]

## 2018-07-02 ENCOUNTER — Other Ambulatory Visit: Payer: Self-pay

## 2018-07-02 ENCOUNTER — Encounter: Payer: Self-pay | Admitting: Internal Medicine

## 2018-07-02 ENCOUNTER — Ambulatory Visit (INDEPENDENT_AMBULATORY_CARE_PROVIDER_SITE_OTHER): Payer: 59 | Admitting: Internal Medicine

## 2018-07-02 VITALS — BP 150/100 | HR 83 | Temp 97.3°F | Ht 77.0 in | Wt 238.7 lb

## 2018-07-02 DIAGNOSIS — R079 Chest pain, unspecified: Secondary | ICD-10-CM | POA: Diagnosis not present

## 2018-07-02 DIAGNOSIS — I10 Essential (primary) hypertension: Secondary | ICD-10-CM | POA: Diagnosis not present

## 2018-07-02 DIAGNOSIS — I499 Cardiac arrhythmia, unspecified: Secondary | ICD-10-CM

## 2018-07-02 DIAGNOSIS — Z0000 Encounter for general adult medical examination without abnormal findings: Secondary | ICD-10-CM | POA: Diagnosis not present

## 2018-07-02 LAB — LIPID PANEL
Cholesterol: 220 mg/dL — ABNORMAL HIGH (ref 0–200)
HDL: 61.9 mg/dL (ref >39.00)
LDL Cholesterol: 138 mg/dL — ABNORMAL HIGH (ref 0–99)
NonHDL: 158.49
Total CHOL/HDL Ratio: 4
Triglycerides: 101 mg/dL (ref 0.0–149.0)
VLDL: 20.2 mg/dL (ref 0.0–40.0)

## 2018-07-02 LAB — COMPREHENSIVE METABOLIC PANEL
ALT: 43 U/L (ref 0–53)
AST: 26 U/L (ref 0–37)
Albumin: 4.6 g/dL (ref 3.5–5.2)
Alkaline Phosphatase: 46 U/L (ref 39–117)
BUN: 13 mg/dL (ref 6–23)
CO2: 29 mEq/L (ref 19–32)
Calcium: 9.3 mg/dL (ref 8.4–10.5)
Chloride: 102 mEq/L (ref 96–112)
Creatinine, Ser: 1.16 mg/dL (ref 0.40–1.50)
GFR: 70.41 mL/min (ref >60.00)
Glucose, Bld: 79 mg/dL (ref 70–99)
Potassium: 4 mEq/L (ref 3.5–5.1)
Sodium: 139 mEq/L (ref 135–145)
Total Bilirubin: 1 mg/dL (ref 0.2–1.2)
Total Protein: 6.7 g/dL (ref 6.0–8.3)

## 2018-07-02 LAB — CBC WITH DIFFERENTIAL/PLATELET
Basophils Absolute: 0 10*3/uL (ref 0.0–0.1)
Basophils Relative: 0.5 % (ref 0.0–3.0)
Eosinophils Absolute: 0.1 10*3/uL (ref 0.0–0.7)
Eosinophils Relative: 1.9 % (ref 0.0–5.0)
HCT: 47.3 % (ref 39.0–52.0)
Hemoglobin: 16.9 g/dL (ref 13.0–17.0)
Lymphocytes Relative: 16.6 % (ref 12.0–46.0)
Lymphs Abs: 0.9 10*3/uL (ref 0.7–4.0)
MCHC: 35.6 g/dL (ref 30.0–36.0)
MCV: 87.8 fl (ref 78.0–100.0)
Monocytes Absolute: 0.4 10*3/uL (ref 0.1–1.0)
Monocytes Relative: 7.7 % (ref 3.0–12.0)
Neutro Abs: 4.1 10*3/uL (ref 1.4–7.7)
Neutrophils Relative %: 73.3 % (ref 43.0–77.0)
Platelets: 153 10*3/uL (ref 150.0–400.0)
RBC: 5.39 Mil/uL (ref 4.22–5.81)
RDW: 12.9 % (ref 11.5–15.5)
WBC: 5.6 10*3/uL (ref 4.0–10.5)

## 2018-07-02 LAB — HEMOGLOBIN A1C: Hgb A1c MFr Bld: 4.7 % (ref 4.6–6.5)

## 2018-07-02 LAB — TSH: TSH: 1.19 u[IU]/mL (ref 0.35–4.50)

## 2018-07-02 LAB — VITAMIN B12: Vitamin B-12: 426 pg/mL (ref 211–911)

## 2018-07-02 LAB — VITAMIN D 25 HYDROXY (VIT D DEFICIENCY, FRACTURES): VITD: 34.13 ng/mL (ref 30.00–100.00)

## 2018-07-02 MED ORDER — METOPROLOL TARTRATE 25 MG PO TABS: 25.0000 mg | ORAL_TABLET | Freq: Two times a day (BID) | ORAL | 1 refills | Status: DC

## 2018-07-02 NOTE — Progress Notes (Signed)
Established Patient Office Visit     CC/Reason for Visit: Annual preventive exam and discuss chest pain  HPI: Lance Jordan is a 38 y.o. male who is coming in today for the above mentioned reasons. Past Medical History is significant for: a history of an irregular heartbeat that occurs about once a month. Has had several EKGs in past that have all been normal. He has noticed elevated BPs in the range of 135-150/90-100.  Last weekend he was waterskiing and was exerting himself when he had substernal chest pain that resolved after resting for a while.  This is never happened before so he became concerned and scheduled this visit.  He has not a smoker, his family history is significant for a maternal grandfather had an MI at age 40, paternal grandmother who is still alive but who has coronary artery disease, mother has hypertension, uncle died of an MI at age 9.  He has routine dental care, sees an eye doctor every 3 to 4 years.   Past Medical/Surgical History: Past Medical History:  Diagnosis Date  . Heart murmur    as a child  . History of chicken pox     Past Surgical History:  Procedure Laterality Date  . HERNIA REPAIR     as a child- as surgery performed, dr realized no hernia present    Social History:  reports that he has never smoked. He has never used smokeless tobacco. He reports current alcohol use of about 14.0 standard drinks of alcohol per week. He reports that he does not use drugs.  Allergies: No Known Allergies  Family History:  Family History  Problem Relation Age of Onset  . Hyperlipidemia Mother   . Ovarian cancer Maternal Grandmother   . Hyperlipidemia Paternal Grandmother   . Arthritis Paternal Grandmother   . Heart disease Paternal Grandmother   . Breast cancer Paternal Grandmother   . Colon cancer Neg Hx   . Esophageal cancer Neg Hx   . Colon polyps Neg Hx   . Stomach cancer Neg Hx   . Rectal cancer Neg Hx      Current Outpatient  Medications:  .  ibuprofen (ADVIL,MOTRIN) 200 MG tablet, Take 400 mg by mouth as needed., Disp: , Rfl:  .  metoprolol tartrate (LOPRESSOR) 25 MG tablet, Take 1 tablet (25 mg total) by mouth 2 (two) times daily., Disp: 180 tablet, Rfl: 1  Current Facility-Administered Medications:  .  0.9 %  sodium chloride infusion, 500 mL, Intravenous, Once, Armbruster, Carlota Raspberry, MD  Review of Systems:  Constitutional: Denies fever, chills, diaphoresis, appetite change and fatigue.  HEENT: Denies photophobia, eye pain, redness, hearing loss, ear pain, congestion, sore throat, rhinorrhea, sneezing, mouth sores, trouble swallowing, neck pain, neck stiffness and tinnitus.   Respiratory: Denies SOB, DOE, cough, chest tightness,  and wheezing.   Cardiovascular: Denies  palpitations and leg swelling.  Gastrointestinal: Denies nausea, vomiting, abdominal pain, diarrhea, constipation, blood in stool and abdominal distention.  Genitourinary: Denies dysuria, urgency, frequency, hematuria, flank pain and difficulty urinating.  Endocrine: Denies: hot or cold intolerance, sweats, changes in hair or nails, polyuria, polydipsia. Musculoskeletal: Denies myalgias, back pain, joint swelling, arthralgias and gait problem.  Skin: Denies pallor, rash and wound.  Neurological: Denies dizziness, seizures, syncope, weakness, light-headedness, numbness and headaches.  Hematological: Denies adenopathy. Easy bruising, personal or family bleeding history  Psychiatric/Behavioral: Denies suicidal ideation, mood changes, confusion, nervousness, sleep disturbance and agitation    Physical Exam: Vitals:  07/02/18 1134  BP: (!) 150/100  Pulse: 83  Temp: (!) 97.3 F (36.3 C)  TempSrc: Oral  SpO2: 97%  Weight: 238 lb 11.2 oz (108.3 kg)  Height: '6\' 5"'$  (1.956 m)    Body mass index is 28.31 kg/m.   Constitutional: NAD, calm, comfortable Eyes: PERRL, lids and conjunctivae normal ENMT: Mucous membranes are moist. Posterior  pharynx clear of any exudate or lesions. Normal dentition. Tympanic membrane is pearly white, no erythema or bulging. Neck: normal, supple, no masses, no thyromegaly Respiratory: clear to auscultation bilaterally, no wheezing, no crackles. Normal respiratory effort. No accessory muscle use.  Cardiovascular: Regular rate and rhythm, no murmurs / rubs / gallops. No extremity edema. 2+ pedal pulses. No carotid bruits.  Abdomen: no tenderness, no masses palpated. No hepatosplenomegaly. Bowel sounds positive.  Musculoskeletal: no clubbing / cyanosis. No joint deformity upper and lower extremities. Good ROM, no contractures. Normal muscle tone.  Skin: no rashes, lesions, ulcers. No induration Neurologic: CN 2-12 grossly intact. Sensation intact, DTR normal. Strength 5/5 in all 4.  Psychiatric: Normal judgment and insight. Alert and oriented x 3. Normal mood.    Impression and Plan:  Encounter for preventive health examination  -Have recommended routine eye and dental care. -Immunizations are up-to-date. -Commence routine cancer screening at age 26. -Discussed healthy lifestyle in detail today. -Screening labs to be performed today.  Cardiac arrhythmia, unspecified cardiac arrhythmia type  -EKG without abnormalities today. -If continues to have issues despite correction of blood pressure can consider 30-day event monitor.  Essential hypertension  -I feel comfortable making this diagnosis today given elevated blood pressure in office in conjunction with elevated measurements at home. -Start metoprolol 25 mg twice daily. -Return visit in 6 weeks for blood pressure management.  Chest pain, unspecified type  -I am a little concerned with his history of substernal chest pain and his family history. -May just be related to markedly elevated blood pressure, however will check lipid panel to further risk stratify, 2D echo. -EKG performed in office today and interpreted by myself as normal sinus  rhythm at a rate of 75, normal axis, no acute ST or T wave changes. -Further work-up including potential cardiology referral pending results.    Patient Instructions  -Nice seeing you today!!  -Lab work today; will notify you once results are available.  -Start taking metoprolol 25 mg twice daily.  -Echo will be requested today.  -Check blood pressure 2-3 times a week at home and bring these measurements in to your next visit.  -Schedule follow up in 6 weeks for BP check.   Preventive Care 25-52 Years Old, Male Preventive care refers to lifestyle choices and visits with your health care provider that can promote health and wellness. This includes:  A yearly physical exam. This is also called an annual well check.  Regular dental and eye exams.  Immunizations.  Screening for certain conditions.  Healthy lifestyle choices, such as eating a healthy diet, getting regular exercise, not using drugs or products that contain nicotine and tobacco, and limiting alcohol use. What can I expect for my preventive care visit? Physical exam Your health care provider will check:  Height and weight. These may be used to calculate body mass index (BMI), which is a measurement that tells if you are at a healthy weight.  Heart rate and blood pressure.  Your skin for abnormal spots. Counseling Your health care provider may ask you questions about:  Alcohol, tobacco, and drug use.  Emotional well-being.  Home and relationship well-being.  Sexual activity.  Eating habits.  Work and work Statistician. What immunizations do I need?  Influenza (flu) vaccine  This is recommended every year. Tetanus, diphtheria, and pertussis (Tdap) vaccine  You may need a Td booster every 10 years. Varicella (chickenpox) vaccine  You may need this vaccine if you have not already been vaccinated. Human papillomavirus (HPV) vaccine  If recommended by your health care provider, you may need three  doses over 6 months. Measles, mumps, and rubella (MMR) vaccine  You may need at least one dose of MMR. You may also need a second dose. Meningococcal conjugate (MenACWY) vaccine  One dose is recommended if you are 88-43 years old and a Market researcher living in a residence hall, or if you have one of several medical conditions. You may also need additional booster doses. Pneumococcal conjugate (PCV13) vaccine  You may need this if you have certain conditions and were not previously vaccinated. Pneumococcal polysaccharide (PPSV23) vaccine  You may need one or two doses if you smoke cigarettes or if you have certain conditions. Hepatitis A vaccine  You may need this if you have certain conditions or if you travel or work in places where you may be exposed to hepatitis A. Hepatitis B vaccine  You may need this if you have certain conditions or if you travel or work in places where you may be exposed to hepatitis B. Haemophilus influenzae type b (Hib) vaccine  You may need this if you have certain risk factors. You may receive vaccines as individual doses or as more than one vaccine together in one shot (combination vaccines). Talk with your health care provider about the risks and benefits of combination vaccines. What tests do I need? Blood tests  Lipid and cholesterol levels. These may be checked every 5 years starting at age 2.  Hepatitis C test.  Hepatitis B test. Screening   Diabetes screening. This is done by checking your blood sugar (glucose) after you have not eaten for a while (fasting).  Sexually transmitted disease (STD) testing. Talk with your health care provider about your test results, treatment options, and if necessary, the need for more tests. Follow these instructions at home: Eating and drinking   Eat a diet that includes fresh fruits and vegetables, whole grains, lean protein, and low-fat dairy products.  Take vitamin and mineral supplements  as recommended by your health care provider.  Do not drink alcohol if your health care provider tells you not to drink.  If you drink alcohol: ? Limit how much you have to 0-2 drinks a day. ? Be aware of how much alcohol is in your drink. In the [place] one drink equals one 12 oz bottle of beer (355 mL), one 5 oz glass of wine (148 mL), or one 1 oz glass of hard liquor (44 mL). Lifestyle  Take daily care of your teeth and gums.  Stay active. Exercise for at least 30 minutes on 5 or more days each week.  Do not use any products that contain nicotine or tobacco, such as cigarettes, e-cigarettes, and chewing tobacco. If you need help quitting, ask your health care provider.  If you are sexually active, practice safe sex. Use a condom or other form of protection to prevent STIs (sexually transmitted infections). What's next?  Go to your health care provider once a year for a well check visit.  Ask your health care provider how often you should have your eyes and teeth  checked.  Stay up to date on all vaccines. This information is not intended to replace advice given to you by your health care provider. Make sure you discuss any questions you have with your health care provider. Document Released: 02/13/2001 Document Revised: 12/12/2017 Document Reviewed: 12/12/2017 Elsevier Patient Education  2020 [place] DASH stands for "Dietary Approaches to Stop Hypertension." The DASH eating plan is a healthy eating plan that has been shown to reduce high blood pressure (hypertension). It may also reduce your risk for type 2 diabetes, heart disease, and stroke. The DASH eating plan may also help with weight loss. What are tips for following this plan?  General guidelines  Avoid eating more than 2,300 mg (milligrams) of salt (sodium) a day. If you have hypertension, you may need to reduce your sodium intake to 1,500 mg a day.  Limit alcohol intake to no more than 1 drink a  day for nonpregnant women and 2 drinks a day for men. One drink equals 12 oz of beer, 5 oz of wine, or 1 oz of hard liquor.  Work with your health care provider to maintain a healthy body weight or to lose weight. Ask what an ideal weight is for you.  Get at least 30 minutes of exercise that causes your heart to beat faster (aerobic exercise) most days of the week. Activities may include walking, swimming, or biking.  Work with your health care provider or diet and nutrition specialist (dietitian) to adjust your eating plan to your individual calorie needs. Reading food labels   Check food labels for the amount of sodium per serving. Choose foods with less than 5 percent of the Daily Value of sodium. Generally, foods with less than 300 mg of sodium per serving fit into this eating plan.  To find whole grains, look for the word "whole" as the first word in the ingredient list. Shopping  Buy products labeled as "low-sodium" or "no salt added."  Buy fresh foods. Avoid canned foods and premade or frozen meals. Cooking  Avoid adding salt when cooking. Use salt-free seasonings or herbs instead of table salt or sea salt. Check with your health care provider or pharmacist before using salt substitutes.  Do not fry foods. Cook foods using healthy methods such as baking, boiling, grilling, and broiling instead.  Cook with heart-healthy oils, such as olive, canola, soybean, or sunflower oil. Meal planning  Eat a balanced diet that includes: ? 5 or more servings of fruits and vegetables each day. At each meal, try to fill half of your plate with fruits and vegetables. ? Up to 6-8 servings of whole grains each day. ? Less than 6 oz of lean meat, poultry, or fish each day. A 3-oz serving of meat is about the same size as a deck of cards. One egg equals 1 oz. ? 2 servings of low-fat dairy each day. ? A serving of nuts, seeds, or beans 5 times each week. ? Heart-healthy fats. Healthy fats called  Omega-3 fatty acids are found in foods such as flaxseeds and coldwater fish, like sardines, salmon, and mackerel.  Limit how much you eat of the following: ? Canned or prepackaged foods. ? Food that is high in trans fat, such as fried foods. ? Food that is high in saturated fat, such as fatty meat. ? Sweets, desserts, sugary drinks, and other foods with added sugar. ? Full-fat dairy products.  Do not salt foods before eating.  Try to eat  at least 2 vegetarian meals each week.  Eat more home-cooked food and less restaurant, buffet, and fast food.  When eating at a restaurant, ask that your food be prepared with less salt or no salt, if possible. What foods are recommended? The items listed may not be a complete list. Talk with your dietitian about what dietary choices are best for you. Grains Whole-grain or whole-wheat bread. Whole-grain or whole-wheat pasta. Brown rice. [place]. Bulgur. Whole-grain and low-sodium cereals. Pita bread. Low-fat, low-sodium crackers. Whole-wheat flour tortillas. Vegetables Fresh or frozen vegetables (raw, steamed, roasted, or grilled). Low-sodium or reduced-sodium tomato and vegetable juice. Low-sodium or reduced-sodium tomato sauce and tomato paste. Low-sodium or reduced-sodium canned vegetables. Fruits All fresh, dried, or frozen fruit. Canned fruit in natural juice (without added sugar). Meat and other protein foods Skinless chicken or [place]. Ground chicken or [place]. Pork with fat trimmed off. Fish and seafood. Egg whites. Dried beans, peas, or lentils. Unsalted nuts, nut butters, and seeds. Unsalted canned beans. Lean cuts of beef with fat trimmed off. Low-sodium, lean deli meat. Dairy Low-fat (1%) or fat-free (skim) milk. Fat-free, low-fat, or reduced-fat cheeses. Nonfat, low-sodium ricotta or cottage cheese. Low-fat or nonfat yogurt. Low-fat, low-sodium cheese. Fats and oils Soft margarine without trans fats. Vegetable oil. Low-fat,  reduced-fat, or light mayonnaise and salad dressings (reduced-sodium). Canola, safflower, olive, soybean, and sunflower oils. Avocado. Seasoning and other foods Herbs. Spices. Seasoning mixes without salt. Unsalted popcorn and pretzels. Fat-free sweets. What foods are not recommended? The items listed may not be a complete list. Talk with your dietitian about what dietary choices are best for you. Grains Baked goods made with fat, such as croissants, muffins, or some breads. Dry pasta or rice meal packs. Vegetables Creamed or fried vegetables. Vegetables in a cheese sauce. Regular canned vegetables (not low-sodium or reduced-sodium). Regular canned tomato sauce and paste (not low-sodium or reduced-sodium). Regular tomato and vegetable juice (not low-sodium or reduced-sodium). Angie Fava. Olives. Fruits Canned fruit in a light or heavy syrup. Fried fruit. Fruit in cream or butter sauce. Meat and other protein foods Fatty cuts of meat. Ribs. Fried meat. Berniece Salines. Sausage. Bologna and other processed lunch meats. Salami. Fatback. Hotdogs. Bratwurst. Salted nuts and seeds. Canned beans with added salt. Canned or smoked fish. Whole eggs or egg yolks. Chicken or [place] with skin. Dairy Whole or 2% milk, cream, and half-and-half. Whole or full-fat cream cheese. Whole-fat or sweetened yogurt. Full-fat cheese. Nondairy creamers. Whipped toppings. Processed cheese and cheese spreads. Fats and oils Butter. Stick margarine. Lard. Shortening. Ghee. Bacon fat. Tropical oils, such as coconut, palm kernel, or palm oil. Seasoning and other foods Salted popcorn and pretzels. Onion salt, garlic salt, seasoned salt, table salt, and sea salt. Worcestershire sauce. Tartar sauce. Barbecue sauce. Teriyaki sauce. Soy sauce, including reduced-sodium. Steak sauce. Canned and packaged gravies. Fish sauce. Oyster sauce. Cocktail sauce. Horseradish that you find on the shelf. Ketchup. Mustard. Meat flavorings and tenderizers.  Bouillon cubes. Hot sauce and Tabasco sauce. Premade or packaged marinades. Premade or packaged taco seasonings. Relishes. Regular salad dressings. Where to find more information:  National Heart, Lung, and [place]: https://wilson-eaton.com/  American Heart Association: www.heart.org Summary  The DASH eating plan is a healthy eating plan that has been shown to reduce high blood pressure (hypertension). It may also reduce your risk for type 2 diabetes, heart disease, and stroke.  With the DASH eating plan, you should limit salt (sodium) intake to 2,300 mg a day. If you have hypertension,  you may need to reduce your sodium intake to 1,500 mg a day.  When on the DASH eating plan, aim to eat more fresh fruits and vegetables, whole grains, lean proteins, low-fat dairy, and heart-healthy fats.  Work with your health care provider or diet and nutrition specialist (dietitian) to adjust your eating plan to your individual calorie needs. This information is not intended to replace advice given to you by your health care provider. Make sure you discuss any questions you have with your health care provider. Document Released: 12/07/2010 Document Revised: 11/30/2016 Document Reviewed: 12/12/2015 Elsevier Patient Education  2020 [place], [place] [facility]

## 2018-07-02 NOTE — Patient Instructions (Signed)
-Nice seeing you today!!  -Lab work today; will notify you once results are available.  -Start taking metoprolol 25 mg twice daily.  -Echo will be requested today.  -Check blood pressure 2-3 times a week at home and bring these measurements in to your next visit.  -Schedule follow up in 6 weeks for BP check.   Preventive Care 49-38 Years Old, Male Preventive care refers to lifestyle choices and visits with your health care provider that can promote health and wellness. This includes:  A yearly physical exam. This is also called an annual well check.  Regular dental and eye exams.  Immunizations.  Screening for certain conditions.  Healthy lifestyle choices, such as eating a healthy diet, getting regular exercise, not using drugs or products that contain nicotine and tobacco, and limiting alcohol use. What can I expect for my preventive care visit? Physical exam Your health care provider will check:  Height and weight. These may be used to calculate body mass index (BMI), which is a measurement that tells if you are at a healthy weight.  Heart rate and blood pressure.  Your skin for abnormal spots. Counseling Your health care provider may ask you questions about:  Alcohol, tobacco, and drug use.  Emotional well-being.  Home and relationship well-being.  Sexual activity.  Eating habits.  Work and work Statistician. What immunizations do I need?  Influenza (flu) vaccine  This is recommended every year. Tetanus, diphtheria, and pertussis (Tdap) vaccine  You may need a Td booster every 10 years. Varicella (chickenpox) vaccine  You may need this vaccine if you have not already been vaccinated. Human papillomavirus (HPV) vaccine  If recommended by your health care provider, you may need three doses over 6 months. Measles, mumps, and rubella (MMR) vaccine  You may need at least one dose of MMR. You may also need a second dose. Meningococcal conjugate (MenACWY)  vaccine  One dose is recommended if you are 44-62 years old and a Market researcher living in a residence hall, or if you have one of several medical conditions. You may also need additional booster doses. Pneumococcal conjugate (PCV13) vaccine  You may need this if you have certain conditions and were not previously vaccinated. Pneumococcal polysaccharide (PPSV23) vaccine  You may need one or two doses if you smoke cigarettes or if you have certain conditions. Hepatitis A vaccine  You may need this if you have certain conditions or if you travel or work in places where you may be exposed to hepatitis A. Hepatitis B vaccine  You may need this if you have certain conditions or if you travel or work in places where you may be exposed to hepatitis B. Haemophilus influenzae type b (Hib) vaccine  You may need this if you have certain risk factors. You may receive vaccines as individual doses or as more than one vaccine together in one shot (combination vaccines). Talk with your health care provider about the risks and benefits of combination vaccines. What tests do I need? Blood tests  Lipid and cholesterol levels. These may be checked every 5 years starting at age 65.  Hepatitis C test.  Hepatitis B test. Screening   Diabetes screening. This is done by checking your blood sugar (glucose) after you have not eaten for a while (fasting).  Sexually transmitted disease (STD) testing. Talk with your health care provider about your test results, treatment options, and if necessary, the need for more tests. Follow these instructions at home: Eating and drinking  Eat a diet that includes fresh fruits and vegetables, whole grains, lean protein, and low-fat dairy products.  Take vitamin and mineral supplements as recommended by your health care provider.  Do not drink alcohol if your health care provider tells you not to drink.  If you drink alcohol: ? Limit how much you have  to 0-2 drinks a day. ? Be aware of how much alcohol is in your drink. In the [place] one drink equals one 12 oz bottle of beer (355 mL), one 5 oz glass of wine (148 mL), or one 1 oz glass of hard liquor (44 mL). Lifestyle  Take daily care of your teeth and gums.  Stay active. Exercise for at least 30 minutes on 5 or more days each week.  Do not use any products that contain nicotine or tobacco, such as cigarettes, e-cigarettes, and chewing tobacco. If you need help quitting, ask your health care provider.  If you are sexually active, practice safe sex. Use a condom or other form of protection to prevent STIs (sexually transmitted infections). What's next?  Go to your health care provider once a year for a well check visit.  Ask your health care provider how often you should have your eyes and teeth checked.  Stay up to date on all vaccines. This information is not intended to replace advice given to you by your health care provider. Make sure you discuss any questions you have with your health care provider. Document Released: 02/13/2001 Document Revised: 12/12/2017 Document Reviewed: 12/12/2017 Elsevier Patient Education  2020 [place] DASH stands for "Dietary Approaches to Stop Hypertension." The DASH eating plan is a healthy eating plan that has been shown to reduce high blood pressure (hypertension). It may also reduce your risk for type 2 diabetes, heart disease, and stroke. The DASH eating plan may also help with weight loss. What are tips for following this plan?  General guidelines  Avoid eating more than 2,300 mg (milligrams) of salt (sodium) a day. If you have hypertension, you may need to reduce your sodium intake to 1,500 mg a day.  Limit alcohol intake to no more than 1 drink a day for nonpregnant women and 2 drinks a day for men. One drink equals 12 oz of beer, 5 oz of wine, or 1 oz of hard liquor.  Work with your health care provider to  maintain a healthy body weight or to lose weight. Ask what an ideal weight is for you.  Get at least 30 minutes of exercise that causes your heart to beat faster (aerobic exercise) most days of the week. Activities may include walking, swimming, or biking.  Work with your health care provider or diet and nutrition specialist (dietitian) to adjust your eating plan to your individual calorie needs. Reading food labels   Check food labels for the amount of sodium per serving. Choose foods with less than 5 percent of the Daily Value of sodium. Generally, foods with less than 300 mg of sodium per serving fit into this eating plan.  To find whole grains, look for the word "whole" as the first word in the ingredient list. Shopping  Buy products labeled as "low-sodium" or "no salt added."  Buy fresh foods. Avoid canned foods and premade or frozen meals. Cooking  Avoid adding salt when cooking. Use salt-free seasonings or herbs instead of table salt or sea salt. Check with your health care provider or pharmacist before using salt substitutes.  Do not  fry foods. Cook foods using healthy methods such as baking, boiling, grilling, and broiling instead.  Cook with heart-healthy oils, such as olive, canola, soybean, or sunflower oil. Meal planning  Eat a balanced diet that includes: ? 5 or more servings of fruits and vegetables each day. At each meal, try to fill half of your plate with fruits and vegetables. ? Up to 6-8 servings of whole grains each day. ? Less than 6 oz of lean meat, poultry, or fish each day. A 3-oz serving of meat is about the same size as a deck of cards. One egg equals 1 oz. ? 2 servings of low-fat dairy each day. ? A serving of nuts, seeds, or beans 5 times each week. ? Heart-healthy fats. Healthy fats called Omega-3 fatty acids are found in foods such as flaxseeds and coldwater fish, like sardines, salmon, and mackerel.  Limit how much you eat of the following: ? Canned  or prepackaged foods. ? Food that is high in trans fat, such as fried foods. ? Food that is high in saturated fat, such as fatty meat. ? Sweets, desserts, sugary drinks, and other foods with added sugar. ? Full-fat dairy products.  Do not salt foods before eating.  Try to eat at least 2 vegetarian meals each week.  Eat more home-cooked food and less restaurant, buffet, and fast food.  When eating at a restaurant, ask that your food be prepared with less salt or no salt, if possible. What foods are recommended? The items listed may not be a complete list. Talk with your dietitian about what dietary choices are best for you. Grains Whole-grain or whole-wheat bread. Whole-grain or whole-wheat pasta. Brown rice. [place]. Bulgur. Whole-grain and low-sodium cereals. Pita bread. Low-fat, low-sodium crackers. Whole-wheat flour tortillas. Vegetables Fresh or frozen vegetables (raw, steamed, roasted, or grilled). Low-sodium or reduced-sodium tomato and vegetable juice. Low-sodium or reduced-sodium tomato sauce and tomato paste. Low-sodium or reduced-sodium canned vegetables. Fruits All fresh, dried, or frozen fruit. Canned fruit in natural juice (without added sugar). Meat and other protein foods Skinless chicken or [place]. Ground chicken or [place]. Pork with fat trimmed off. Fish and seafood. Egg whites. Dried beans, peas, or lentils. Unsalted nuts, nut butters, and seeds. Unsalted canned beans. Lean cuts of beef with fat trimmed off. Low-sodium, lean deli meat. Dairy Low-fat (1%) or fat-free (skim) milk. Fat-free, low-fat, or reduced-fat cheeses. Nonfat, low-sodium ricotta or cottage cheese. Low-fat or nonfat yogurt. Low-fat, low-sodium cheese. Fats and oils Soft margarine without trans fats. Vegetable oil. Low-fat, reduced-fat, or light mayonnaise and salad dressings (reduced-sodium). Canola, safflower, olive, soybean, and sunflower oils. Avocado. Seasoning and other foods Herbs. Spices.  Seasoning mixes without salt. Unsalted popcorn and pretzels. Fat-free sweets. What foods are not recommended? The items listed may not be a complete list. Talk with your dietitian about what dietary choices are best for you. Grains Baked goods made with fat, such as croissants, muffins, or some breads. Dry pasta or rice meal packs. Vegetables Creamed or fried vegetables. Vegetables in a cheese sauce. Regular canned vegetables (not low-sodium or reduced-sodium). Regular canned tomato sauce and paste (not low-sodium or reduced-sodium). Regular tomato and vegetable juice (not low-sodium or reduced-sodium). Angie Fava. Olives. Fruits Canned fruit in a light or heavy syrup. Fried fruit. Fruit in cream or butter sauce. Meat and other protein foods Fatty cuts of meat. Ribs. Fried meat. Berniece Salines. Sausage. Bologna and other processed lunch meats. Salami. Fatback. Hotdogs. Bratwurst. Salted nuts and seeds. Canned beans with added salt.  Canned or smoked fish. Whole eggs or egg yolks. Chicken or [place] with skin. Dairy Whole or 2% milk, cream, and half-and-half. Whole or full-fat cream cheese. Whole-fat or sweetened yogurt. Full-fat cheese. Nondairy creamers. Whipped toppings. Processed cheese and cheese spreads. Fats and oils Butter. Stick margarine. Lard. Shortening. Ghee. Bacon fat. Tropical oils, such as coconut, palm kernel, or palm oil. Seasoning and other foods Salted popcorn and pretzels. Onion salt, garlic salt, seasoned salt, table salt, and sea salt. Worcestershire sauce. Tartar sauce. Barbecue sauce. Teriyaki sauce. Soy sauce, including reduced-sodium. Steak sauce. Canned and packaged gravies. Fish sauce. Oyster sauce. Cocktail sauce. Horseradish that you find on the shelf. Ketchup. Mustard. Meat flavorings and tenderizers. Bouillon cubes. Hot sauce and Tabasco sauce. Premade or packaged marinades. Premade or packaged taco seasonings. Relishes. Regular salad dressings. Where to find more information:   National Heart, Lung, and [place]: https://wilson-eaton.com/  American Heart Association: www.heart.org Summary  The DASH eating plan is a healthy eating plan that has been shown to reduce high blood pressure (hypertension). It may also reduce your risk for type 2 diabetes, heart disease, and stroke.  With the DASH eating plan, you should limit salt (sodium) intake to 2,300 mg a day. If you have hypertension, you may need to reduce your sodium intake to 1,500 mg a day.  When on the DASH eating plan, aim to eat more fresh fruits and vegetables, whole grains, lean proteins, low-fat dairy, and heart-healthy fats.  Work with your health care provider or diet and nutrition specialist (dietitian) to adjust your eating plan to your individual calorie needs. This information is not intended to replace advice given to you by your health care provider. Make sure you discuss any questions you have with your health care provider. Document Released: 12/07/2010 Document Revised: 11/30/2016 Document Reviewed: 12/12/2015 Elsevier Patient Education  2020 [employer].

## 2018-08-06 ENCOUNTER — Ambulatory Visit (HOSPITAL_COMMUNITY): Payer: 59 | Attending: Internal Medicine

## 2018-08-06 ENCOUNTER — Ambulatory Visit: Payer: Self-pay

## 2018-08-06 ENCOUNTER — Other Ambulatory Visit: Payer: Self-pay

## 2018-08-06 DIAGNOSIS — R079 Chest pain, unspecified: Secondary | ICD-10-CM | POA: Diagnosis present

## 2018-08-06 NOTE — Telephone Encounter (Signed)
Provided lab results to Patient .  Voiced understanding.   

## 2018-08-12 ENCOUNTER — Encounter: Payer: Self-pay | Admitting: Internal Medicine

## 2018-08-12 ENCOUNTER — Other Ambulatory Visit: Payer: Self-pay

## 2018-08-12 ENCOUNTER — Ambulatory Visit (INDEPENDENT_AMBULATORY_CARE_PROVIDER_SITE_OTHER): Payer: 59 | Admitting: Internal Medicine

## 2018-08-12 VITALS — BP 116/80 | HR 66 | Temp 98.9°F | Wt 227.9 lb

## 2018-08-12 DIAGNOSIS — I10 Essential (primary) hypertension: Secondary | ICD-10-CM | POA: Diagnosis not present

## 2018-08-12 DIAGNOSIS — I499 Cardiac arrhythmia, unspecified: Secondary | ICD-10-CM

## 2018-08-12 NOTE — Patient Instructions (Signed)
-  Nice seeing you today!!  -Blood pressure looks great!  -Schedule follow up in 6 months.

## 2018-08-12 NOTE — Progress Notes (Signed)
Established Patient Office Visit     CC/Reason for Visit: BP Follow up  HPI: Lance Jordan is a 38 y.o. male who is coming in today for the above mentioned reasons. Past Medical History is significant for: recently diagnosed HTN started on metoprolol 25 mg BID 6 weeks ago. No complaints today. Still has occasional circumstances of a fast irregular heart beat. He has been adhering to a low sodium diet and has lost 12 pounds since his last visit.   Past Medical/Surgical History: Past Medical History:  Diagnosis Date  . Heart murmur    as a child  . History of chicken pox     Past Surgical History:  Procedure Laterality Date  . HERNIA REPAIR     as a child- as surgery performed, dr realized no hernia present    Social History:  reports that he has never smoked. He has never used smokeless tobacco. He reports current alcohol use of about 14.0 standard drinks of alcohol per week. He reports that he does not use drugs.  Allergies: No Known Allergies  Family History:  Family History  Problem Relation Age of Onset  . Hyperlipidemia Mother   . Ovarian cancer Maternal Grandmother   . Hyperlipidemia Paternal Grandmother   . Arthritis Paternal Grandmother   . Heart disease Paternal Grandmother   . Breast cancer Paternal Grandmother   . Colon cancer Neg Hx   . Esophageal cancer Neg Hx   . Colon polyps Neg Hx   . Stomach cancer Neg Hx   . Rectal cancer Neg Hx      Current Outpatient Medications:  .  ibuprofen (ADVIL,MOTRIN) 200 MG tablet, Take 400 mg by mouth as needed., Disp: , Rfl:  .  metoprolol tartrate (LOPRESSOR) 25 MG tablet, Take 1 tablet (25 mg total) by mouth 2 (two) times daily., Disp: 180 tablet, Rfl: 1  Current Facility-Administered Medications:  .  0.9 %  sodium chloride infusion, 500 mL, Intravenous, Once, Armbruster, Willaim RayasSteven P, MD  Review of Systems:  Constitutional: Denies fever, chills, diaphoresis, appetite change and fatigue.  HEENT: Denies  photophobia, eye pain, redness, hearing loss, ear pain, congestion, sore throat, rhinorrhea, sneezing, mouth sores, trouble swallowing, neck pain, neck stiffness and tinnitus.   Respiratory: Denies SOB, DOE, cough, chest tightness,  and wheezing.   Cardiovascular: Denies chest pain, palpitations and leg swelling.  Gastrointestinal: Denies nausea, vomiting, abdominal pain, diarrhea, constipation, blood in stool and abdominal distention.  Genitourinary: Denies dysuria, urgency, frequency, hematuria, flank pain and difficulty urinating.  Endocrine: Denies: hot or cold intolerance, sweats, changes in hair or nails, polyuria, polydipsia. Musculoskeletal: Denies myalgias, back pain, joint swelling, arthralgias and gait problem.  Skin: Denies pallor, rash and wound.  Neurological: Denies dizziness, seizures, syncope, weakness, light-headedness, numbness and headaches.  Hematological: Denies adenopathy. Easy bruising, personal or family bleeding history  Psychiatric/Behavioral: Denies suicidal ideation, mood changes, confusion, nervousness, sleep disturbance and agitation    Physical Exam: Vitals:   08/12/18 1132  BP: 116/80  Pulse: 66  Temp: 98.9 F (37.2 C)  TempSrc: Temporal  SpO2: 97%  Weight: 227 lb 14.4 oz (103.4 kg)    Body mass index is 27.02 kg/m.   Constitutional: NAD, calm, comfortable Eyes: PERRL, lids and conjunctivae normal ENMT: Mucous membranes are moist.  Respiratory: clear to auscultation bilaterally, no wheezing, no crackles. Normal respiratory effort. No accessory muscle use.  Cardiovascular: Regular rate and irregular rhythm, no murmurs / rubs / gallops. No extremity edema. 2+ pedal  pulses. No carotid bruits.  Abdomen: no tenderness, no masses palpated. No hepatosplenomegaly. Bowel sounds positive.  Musculoskeletal: no clubbing / cyanosis. No joint deformity upper and lower extremities. Good ROM, no contractures. Normal muscle tone.  Skin: no rashes, lesions, ulcers.  No induration Neurologic: CN 2-12 grossly intact. Sensation intact, DTR normal. Strength 5/5 in all 4.  Psychiatric: Normal judgment and insight. Alert and oriented x 3. Normal mood.    Impression and Plan:  Essential hypertension -Well controlled on current metoprolol dose. -F/u 6 months.  Irregular heart beat -EKG performed in office today and interpreted by myself: Normal sinus rhythm, normal axis, no acute ST or T wave changes. -We have discussed possibility of Holter versus 30-day event monitor if palpitations become more frequent.  For now he would like to observe.    Patient Instructions  -Nice seeing you today!!  -Blood pressure looks great!  -Schedule follow up in 6 months.     Lelon Frohlich, MD [facility]

## 2018-09-16 ENCOUNTER — Encounter: Payer: Self-pay | Admitting: Internal Medicine

## 2018-09-17 ENCOUNTER — Encounter: Payer: Self-pay | Admitting: Internal Medicine

## 2018-09-17 DIAGNOSIS — I499 Cardiac arrhythmia, unspecified: Secondary | ICD-10-CM

## 2018-10-01 ENCOUNTER — Encounter: Payer: Self-pay | Admitting: Internal Medicine

## 2018-11-05 ENCOUNTER — Other Ambulatory Visit: Payer: Self-pay

## 2018-11-05 ENCOUNTER — Ambulatory Visit (INDEPENDENT_AMBULATORY_CARE_PROVIDER_SITE_OTHER): Payer: 59 | Admitting: Cardiology

## 2018-11-05 ENCOUNTER — Encounter: Payer: Self-pay | Admitting: Cardiology

## 2018-11-05 VITALS — BP 134/87 | HR 75 | Temp 97.9°F | Ht 77.0 in | Wt 221.0 lb

## 2018-11-05 DIAGNOSIS — Z7189 Other specified counseling: Secondary | ICD-10-CM

## 2018-11-05 DIAGNOSIS — R072 Precordial pain: Secondary | ICD-10-CM

## 2018-11-05 DIAGNOSIS — I10 Essential (primary) hypertension: Secondary | ICD-10-CM | POA: Diagnosis not present

## 2018-11-05 DIAGNOSIS — Z01812 Encounter for preprocedural laboratory examination: Secondary | ICD-10-CM | POA: Diagnosis not present

## 2018-11-05 DIAGNOSIS — R002 Palpitations: Secondary | ICD-10-CM

## 2018-11-05 NOTE — Progress Notes (Signed)
Cardiology Office Note:    Date:  11/05/2018   ID:  Lance Jordan, DOB 03-20-1980, MRN [id number]  PCP:  Philip Aspen, Limmie Patricia, MD  Cardiologist:  Jodelle Red, MD  Referring MD: Philip Aspen, Estel*   CC: new patient consultation for irregular heart beat, also has chest discomfort with heavy exertion  History of Present Illness:    Lance Jordan is a 38 y.o. male with a hx of recently diagnosed hypertension who is seen as a new consult at the request of Keith Rake* for the evaluation and management of irregular heart beat.  Patient concerns today: -every 6 weeks, has a 12 hour episode of irregular heart beats. Has been happening for the last few years -last few weeks, wakes up in the middle of the night, feels like his chest is spasming. HR is normal at this time -hikes/runs/walks daily, but when he did heavy exertion, get discomfort in the center of his chest (has only happened a few time)  -Has been on metoprolol, lost 25 lbs since initially seen by Dr. Ardyth Harps   Has BP cuff, checks at home. Numbers have been 127/82, 125/79, 128/89, 114/83.   -Initial onset: 2-3 years ago -Frequency/Duration: has been stable, about every 4-6 weeks, for about 12 hours -Associated symptoms/factors: HR isn't faster, but feels it as irregular/hard beats. Not painful, but makes it hard to sleep. Improved with walking/being active or being able to go to sleep. No clear aggravating factors--not related to traveling, diet, activity, caffeine, alcohol -Syncope/near syncope: none -Prior cardiac history: none, though told he had a heart murmur as a child -Prior ECG: all NSR (11/11/2015 normal though read as flutter) -Prior workup: echo 08/2018 -Prior treatment: started metoprolol 07/2018, hasn't changed sensation though BP improved.  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 -Cardiac ROS: no chest pain, no shortness of breath, no PND, no orthopnea, no LE edema. -Family history: pat gma has had mutliple heart issues (MI, heart surgery, angioplasty), still alive in her 60s. Both parents are generally health, only BP. [name] died of MI age 68. Mother's brother died of HF in his 86s, other brother has long history of heart issues  Past Medical History:  Diagnosis Date  . Heart murmur    as a child  . History of chicken pox     Past Surgical History:  Procedure Laterality Date  . HERNIA REPAIR     as a child- as surgery performed, dr realized no hernia present    Current Medications: Current Outpatient Medications on File Prior to Visit  Medication Sig  . ibuprofen (ADVIL,MOTRIN) 200 MG tablet Take 400 mg by mouth as needed.  . metoprolol tartrate (LOPRESSOR) 25 MG tablet Take 1 tablet (25 mg total) by mouth 2 (two) times daily.   Current Facility-Administered Medications on File Prior to Visit  Medication  . 0.9 %  sodium chloride infusion     Allergies:   Patient has no known allergies.   Social History   Tobacco Use  . Smoking status: Never Smoker  . Smokeless tobacco: Never Used  Substance Use Topics  . Alcohol use: Yes    Alcohol/week: 14.0 standard drinks    Types: 7 Glasses of  wine, 7 Cans of beer per week    Comment: 2-3 drinks every night  . Drug use: No    Family History: family history includes Arthritis in his paternal grandmother; Breast cancer in his paternal grandmother; Heart disease in his paternal grandmother; Hyperlipidemia in his mother and paternal grandmother; Ovarian cancer in his maternal grandmother. There is no history of Colon cancer, Esophageal cancer, Colon polyps, Stomach cancer, or  Rectal cancer.  ROS:   Please see the history of present illness.  Additional pertinent ROS: Constitutional: Negative for chills, fever, night sweats, unintentional weight loss  HENT: Negative for ear pain and hearing loss.   Eyes: Negative for loss of vision and eye pain.  Respiratory: Negative for cough, sputum, wheezing.   Cardiovascular: See HPI. Gastrointestinal: Negative for abdominal pain, melena, and hematochezia.  Genitourinary: Negative for dysuria and hematuria.  Musculoskeletal: Negative for falls and myalgias.  Skin: Negative for itching and rash.  Neurological: Negative for focal weakness, focal sensory changes and loss of consciousness.  Endo/Heme/Allergies: Does not bruise/bleed easily.     EKGs/Labs/Other Studies Reviewed:    The following studies were reviewed today: Echo 08/06/2018  1. The left ventricle has normal systolic function with an ejection fraction of 60-65%. The cavity size was normal. Left ventricular diastolic parameters were normal. No evidence of left ventricular regional wall motion abnormalities.  2. The right ventricle has normal systolic function. The cavity was normal. There is no increase in right ventricular wall thickness. Right ventricular systolic pressure could not be assessed.  3. The aortic valve is tricuspid. No stenosis of the aortic valve.  4. The aorta is normal in size and structure.  EKG:  EKG is personally reviewed.  The ekg ordered today demonstrates NSR at 65 bpm  Recent Labs: 07/02/2018: ALT 43; BUN 13; Creatinine, Ser 1.16; Hemoglobin 16.9; Platelets 153.0; Potassium 4.0; Sodium 139; TSH 1.19  Recent Lipid Panel    Component Value Date/Time   CHOL 220 (H) 07/02/2018 1213   TRIG 101.0 07/02/2018 1213   HDL 61.90 07/02/2018 1213   CHOLHDL 4 07/02/2018 1213   VLDL 20.2 07/02/2018 1213   LDLCALC 138 (H) 07/02/2018 1213    Physical Exam:    VS:  BP 134/87   Pulse 75   Temp 97.9 F (36.6 C)   Ht 6\' 5"  (1.956 m)   Wt 221  lb (100.2 kg)   SpO2 100%   BMI 26.21 kg/m     Wt Readings from Last 3 Encounters:  11/05/18 221 lb (100.2 kg)  08/12/18 227 lb 14.4 oz (103.4 kg)  07/02/18 238 lb 11.2 oz (108.3 kg)    GEN: Well nourished, well developed in no acute distress HEENT: Normal, moist mucous membranes NECK: No JVD CARDIAC: regular rhythm, normal S1 and S2, no rubs or gallops. No murmurs. VASCULAR: Radial and DP pulses 2+ bilaterally. No carotid bruits RESPIRATORY:  Clear to auscultation without rales, wheezing or rhonchi  ABDOMEN: Soft, non-tender, non-distended MUSCULOSKELETAL:  Ambulates independently SKIN: Warm and dry, no edema NEUROLOGIC:  Alert and oriented x 3. No focal neuro deficits noted. PSYCHIATRIC:  Normal affect    ASSESSMENT:    1. Precordial pain   2. Pre-procedure lab exam   3. Essential hypertension   4. Heart palpitations   5. Cardiac risk counseling   6. Counseling on health promotion and disease prevention    PLAN:    Chest tightness: concerning that this is with heavy exertion, typical symptoms. He is young but does  have risk factors -discussed treadmill stress, nuclear stress/lexiscan, and CT coronary angiography. Discussed pros and cons of each, including but not limited to false positive/false negative risk, radiation risk, and risk of IV contrast dye. Based on shared decision making, decision was made to pursue CT coronary angiography. -will give one time dose of metoprolol -counseled on need to get BMET prior to test -counseled on use of sublingual nitroglycerin and its importance to a good test  Intermittent palpitations: discussed given the frequency of his symptoms, we may not catch it on a traditional monitor. Discussed [employer] -he will consider. If he pursues [place], discussed how he can send me strips via MyChart  Hypertension: now off medication after weight loss, continue to monitor  Cardiac risk counseling and prevention recommendations:  -recommend heart healthy/Mediterranean diet, with whole grains, fruits, vegetable, fish, lean meats, nuts, and olive oil. Limit salt. -recommend moderate walking, 3-5 times/week for 30-50 minutes each session. Aim for at least 150 minutes.week. Goal should be pace of 3 miles/hours, or walking 1.5 miles in 30 minutes -recommend avoidance of tobacco products. Avoid excess alcohol. -ASCVD risk score: The ASCVD Risk score Denman George DC Jr., et al., 2013) failed to calculate for the following reasons:   The 2013 ASCVD risk score is only valid for ages 26 to 82    Plan for follow up: 2 mos  Medication Adjustments/Labs and Tests Ordered: Current medicines are reviewed at length with the patient today.  Concerns regarding medicines are outlined above.  Orders Placed This Encounter  Procedures  . CT CORONARY MORPH W/CTA COR W/SCORE W/CA W/CM &/OR WO/CM  . CT CORONARY FRACTIONAL FLOW RESERVE DATA PREP  . CT CORONARY FRACTIONAL FLOW RESERVE FLUID ANALYSIS  . Basic metabolic panel  . EKG 12-Lead   No orders of the defined types were placed in this encounter.   Patient Instructions  Medication Instructions:  Take 12.5 mg (1/2 pill) metoprolol AM and PM for one week, then 1/2 pill in AM only for one week, then stop.  *If you need a refill on your cardiac medications before your next appointment, please call your pharmacy*  Lab Work: Your physician recommends that you return for lab work 1 week prior to procedure (BMP)  If you have labs (blood work) drawn today and your tests are completely normal, you will receive your results only by: Marland Kitchen MyChart Message (if you have MyChart) OR . A paper copy in the mail If you have any lab test that is abnormal or we need to change your treatment, we will call you to review the results.  Testing/Procedures: Non-Cardiac CT Angiography (CTA), is a special type of CT scan that uses a computer to produce multi-dimensional views of major blood vessels throughout the  body. In CT angiography, a contrast material is injected through an IV to help visualize the blood vessels   Follow-Up: At [facility], you and your health needs are our priority.  As part of our continuing mission to provide you with exceptional heart care, we have created designated Provider Care Teams.  These Care Teams include your primary Cardiologist (physician) and Advanced Practice Providers (APPs -  Physician Assistants and Nurse Practitioners) who all work together to provide you with the care you need, when you need it.  Your next appointment:   2 months  The format for your next appointment:   In Person  Provider:   Jodelle Red, MD  Other Instructions [employer] [email or web address]  Your cardiac CT will be  scheduled at one of the below locations:   [facility] [address] [place], [place] [address] [phone]  If scheduled at [facility], please arrive at the [facility] main entrance of [facility] 30-45 minutes prior to test start time. Proceed to the [facility] Radiology Department (first floor) to check-in and test prep.  If scheduled at [facility], please arrive 15 mins early for check-in and test prep.  Please follow these instructions carefully (unless otherwise directed):  Hold all erectile dysfunction medications at least 3 days (72 hrs) prior to test.  On the Night Before the Test: . Be sure to Drink plenty of water. . Do not consume any caffeinated/decaffeinated beverages or chocolate 12 hours prior to your test. . Do not take any antihistamines 12 hours prior to your test.   On the Day of the Test: . Drink plenty of water. Do not drink any water within one hour of the test. . Do not eat any food 4 hours prior to the test. . You may take your regular medications prior to the test.  . Take metoprolol (Lopressor) 50 mg two hours prior to  test.       After the Test: . Drink plenty of water. . After receiving IV contrast, you may experience a mild flushed feeling. This is normal. . On occasion, you may experience a mild rash up to 24 hours after the test. This is not dangerous. If this occurs, you can take Benadryl 25 mg and increase your fluid intake. . If you experience trouble breathing, this can be serious. If it is severe call 911 IMMEDIATELY. If it is mild, please call our office. . If you take any of these medications: Glipizide/Metformin, Avandament, Glucavance, please do not take 48 hours after completing test unless otherwise instructed.   Once we have confirmed authorization from your insurance company, we will call you to set up a date and time for your test.   For non-scheduling related questions, please contact the cardiac imaging nurse navigator should you have any questions/concerns: Rockwell Alexandria, RN Navigator Cardiac Imaging [facility] [phone] Office      Signed, Jodelle Red, MD PhD 11/05/2018     [facility]

## 2018-11-05 NOTE — Patient Instructions (Addendum)
Medication Instructions:  Take 12.5 mg (1/2 pill) metoprolol AM and PM for one week, then 1/2 pill in AM only for one week, then stop.  *If you need a refill on your cardiac medications before your next appointment, please call your pharmacy*  Lab Work: Your physician recommends that you return for lab work 1 week prior to procedure (BMP)  If you have labs (blood work) drawn today and your tests are completely normal, you will receive your results only by: Marland Kitchen MyChart Message (if you have MyChart) OR . A paper copy in the mail If you have any lab test that is abnormal or we need to change your treatment, we will call you to review the results.  Testing/Procedures: Non-Cardiac CT Angiography (CTA), is a special type of CT scan that uses a computer to produce multi-dimensional views of major blood vessels throughout the body. In CT angiography, a contrast material is injected through an IV to help visualize the blood vessels   Follow-Up: At [facility], you and your health needs are our priority.  As part of our continuing mission to provide you with exceptional heart care, we have created designated Provider Care Teams.  These Care Teams include your primary Cardiologist (physician) and Advanced Practice Providers (APPs -  Physician Assistants and Nurse Practitioners) who all work together to provide you with the care you need, when you need it.  Your next appointment:   2 months  The format for your next appointment:   In Person  Provider:   Buford Dresser, MD  Other Instructions [employer] [email or web address]  Your cardiac CT will be scheduled at one of the below locations:   [facility] [address] [place], [place] [address] [phone]  If scheduled at [facility], please arrive at the [facility] main entrance of [facility] 30-45 minutes prior to test start time. Proceed to the [facility]  Radiology Department (first floor) to check-in and test prep.  If scheduled at [facility], please arrive 15 mins early for check-in and test prep.  Please follow these instructions carefully (unless otherwise directed):  Hold all erectile dysfunction medications at least 3 days (72 hrs) prior to test.  On the Night Before the Test: . Be sure to Drink plenty of water. . Do not consume any caffeinated/decaffeinated beverages or chocolate 12 hours prior to your test. . Do not take any antihistamines 12 hours prior to your test.   On the Day of the Test: . Drink plenty of water. Do not drink any water within one hour of the test. . Do not eat any food 4 hours prior to the test. . You may take your regular medications prior to the test.  . Take metoprolol (Lopressor) 50 mg two hours prior to test.       After the Test: . Drink plenty of water. . After receiving IV contrast, you may experience a mild flushed feeling. This is normal. . On occasion, you may experience a mild rash up to 24 hours after the test. This is not dangerous. If this occurs, you can take Benadryl 25 mg and increase your fluid intake. . If you experience trouble breathing, this can be serious. If it is severe call 911 IMMEDIATELY. If it is mild, please call our office. . If you take any of these medications: Glipizide/Metformin, Avandament, Glucavance, please do not take 48 hours after completing test unless otherwise instructed.   Once we have confirmed  authorization from your insurance company, we will call you to set up a date and time for your test.   For non-scheduling related questions, please contact the cardiac imaging nurse navigator should you have any questions/concerns: Marchia Bond, RN Navigator Cardiac Imaging [facility] [phone] Office

## 2018-12-04 ENCOUNTER — Encounter: Payer: Self-pay | Admitting: Cardiology

## 2019-01-06 ENCOUNTER — Telehealth (HOSPITAL_COMMUNITY): Payer: Self-pay | Admitting: Emergency Medicine

## 2019-01-06 ENCOUNTER — Encounter (HOSPITAL_COMMUNITY): Payer: Self-pay

## 2019-01-06 LAB — BASIC METABOLIC PANEL
BUN/Creatinine Ratio: 11 (ref 9–20)
BUN: 11 mg/dL (ref 6–20)
CO2: 25 mmol/L (ref 20–29)
Calcium: 9.8 mg/dL (ref 8.7–10.2)
Chloride: 101 mmol/L (ref 96–106)
Creatinine, Ser: 1.04 mg/dL (ref 0.76–1.27)
GFR calc Af Amer: 105 mL/min/{1.73_m2} (ref >59)
GFR calc non Af Amer: 91 mL/min/{1.73_m2} (ref >59)
Glucose: 73 mg/dL (ref 65–99)
Potassium: 4.3 mmol/L (ref 3.5–5.2)
Sodium: 140 mmol/L (ref 134–144)

## 2019-01-06 NOTE — Telephone Encounter (Signed)
Reaching out to patient to offer assistance regarding upcoming cardiac imaging study; pt verbalizes understanding of appt date/time, parking situation and where to check in, pre-test NPO status and medications ordered, and verified current allergies; name and call back number provided for further questions should they arise Shakiera Edelson RN Navigator Cardiac Imaging [facility] [phone] office [phone] cell 

## 2019-01-07 ENCOUNTER — Encounter: Payer: 59 | Admitting: *Deleted

## 2019-01-07 ENCOUNTER — Other Ambulatory Visit: Payer: Self-pay

## 2019-01-07 ENCOUNTER — Encounter (HOSPITAL_COMMUNITY): Payer: Self-pay

## 2019-01-07 ENCOUNTER — Ambulatory Visit (HOSPITAL_COMMUNITY)
Admission: RE | Admit: 2019-01-07 | Discharge: 2019-01-07 | Disposition: A | Discharge status: Home / Self Care | Payer: 59 | Source: Ambulatory Visit | Attending: Cardiology | Admitting: Cardiology

## 2019-01-07 DIAGNOSIS — Z006 Encounter for examination for normal comparison and control in clinical research program: Secondary | ICD-10-CM

## 2019-01-07 DIAGNOSIS — R072 Precordial pain: Secondary | ICD-10-CM | POA: Diagnosis present

## 2019-01-07 MED ORDER — NITROGLYCERIN 0.4 MG SL SUBL
SUBLINGUAL_TABLET | SUBLINGUAL | Status: AC
  Filled 2019-01-07: qty 2

## 2019-01-07 MED ORDER — IOHEXOL 350 MG/ML SOLN
100.0000 mL | Freq: Once | INTRAVENOUS | Status: AC | PRN
  Administered 2019-01-07: 100 mL via INTRAVENOUS

## 2019-01-07 MED ORDER — NITROGLYCERIN 0.4 MG SL SUBL
0.8000 mg | SUBLINGUAL_TABLET | Freq: Once | SUBLINGUAL | Status: AC
  Administered 2019-01-07: 0.8 mg via SUBLINGUAL

## 2019-01-07 NOTE — Research (Signed)
CADFEM Informed Consent                  Subject Name:   Lance Jordan   Subject met inclusion and exclusion criteria.  The informed consent form, study requirements and expectations were reviewed with the subject and questions and concerns were addressed prior to the signing of the consent form.  The subject verbalized understanding of the trial requirements.  The subject agreed to participate in the CADFEM trial and signed the informed consent.  The informed consent was obtained prior to performance of any protocol-specific procedures for the subject.  A copy of the signed informed consent was given to the subject and a copy was placed in the subject's medical record.   [place] Lynda Wanninger, Research Assistant  01/07/2019  07:29 a.m.

## 2019-01-08 ENCOUNTER — Ambulatory Visit (INDEPENDENT_AMBULATORY_CARE_PROVIDER_SITE_OTHER): Payer: 59 | Admitting: Cardiology

## 2019-01-08 ENCOUNTER — Encounter: Payer: Self-pay | Admitting: Cardiology

## 2019-01-08 ENCOUNTER — Other Ambulatory Visit: Payer: Self-pay

## 2019-01-08 VITALS — BP 146/84 | HR 97 | Temp 97.7°F | Ht 77.5 in | Wt 218.8 lb

## 2019-01-08 DIAGNOSIS — Z712 Person consulting for explanation of examination or test findings: Secondary | ICD-10-CM | POA: Diagnosis not present

## 2019-01-08 DIAGNOSIS — I10 Essential (primary) hypertension: Secondary | ICD-10-CM

## 2019-01-08 DIAGNOSIS — I480 Paroxysmal atrial fibrillation: Secondary | ICD-10-CM | POA: Diagnosis not present

## 2019-01-08 DIAGNOSIS — Z7189 Other specified counseling: Secondary | ICD-10-CM

## 2019-01-08 NOTE — Patient Instructions (Signed)

## 2019-01-08 NOTE — Progress Notes (Signed)
Cardiology Office Note:    Date:  01/08/2019   ID:  Lance Jordan, DOB [birth year], MRN [id number]  PCP:  Isaac Bliss, Rayford Halsted, MD  Cardiologist:  Buford Dresser, MD  Referring MD: Isaac Bliss, [place]*   CC: follow up of test results  History of Present Illness:    Lance Jordan is a 39 y.o. male with a hx of recently diagnosed hypertension who is seen for follow up. I initially saw him 11/05/18 as a new consult at the request of Isaac Bliss, [place] [facility]* for the evaluation and management of irregular heart beat and chest tightness.  Today: Didn't sleep well, anxious due to [place] events yesterday.  Symptoms remain the same. BP at home 130/85 on average, weight stable at 215 (down 30 lbs from last year). Has captured events (labeled as possible afib). Chest tightness has resolved since stopping metoprolol.  We reviewed two sets of data today. Reviewed his sent KardiaMobile strips. These contain significant artifact, but they are suggestive of atrial fibrillation. We reviewed what this means, how it is treated/managed. He is highly symptomatic with these and knows exactly when they occur. Rare and self limited. We discussed pill in a pocket strategy today, but he would prefer just to monitor for now. He feels better off metoprolol.  We also reviewed his cardiac CT from yesterday. No CAD noted at all, calcium score 0. Discussed the 10-15 year evidence for calcium score of zero and very low rates of CV events.  Denies chest pain, shortness of breath at rest or with normal exertion. No PND, orthopnea, LE edema or unexpected weight gain. No syncope.  Past Medical History:  Diagnosis Date  . Heart murmur    as a child  . History of chicken pox     Past Surgical History:  Procedure Laterality Date  . HERNIA REPAIR     as a child- as surgery performed, dr realized no hernia present    Current Medications: Current Outpatient Medications on File Prior to Visit  Medication  Sig  . ibuprofen (ADVIL,MOTRIN) 200 MG tablet Take 400 mg by mouth as needed.   Current Facility-Administered Medications on File Prior to Visit  Medication  . 0.9 %  sodium chloride infusion     Allergies:   Patient has no known allergies.   Social History   Tobacco Use  . Smoking status: Never Smoker  . Smokeless tobacco: Never Used  Substance Use Topics  . Alcohol use: Yes    Alcohol/week: 14.0 standard drinks    Types: 7 Glasses of wine, 7 Cans of beer per week    Comment: 2-3 drinks every night  . Drug use: No    Family History: family history includes Arthritis in his paternal grandmother; Breast cancer in his paternal grandmother; Heart disease in his paternal grandmother; Hyperlipidemia in his mother and paternal grandmother; Ovarian cancer in his maternal grandmother. There is no history of Colon cancer, Esophageal cancer, Colon polyps, Stomach cancer, or Rectal cancer.  pat gma has had mutliple heart issues (MI, heart surgery, angioplasty), still alive in her 62s. Both parents are generally health, only BP. [name] died of MI age 50. Mother's brother died of HF in his 26s, other brother has long history of heart issues  ROS:   Please see the history of present illness.  Additional pertinent ROS: Constitutional: Negative for chills, fever, night sweats, unintentional weight loss  HENT: Negative for ear pain and hearing loss.   Eyes: Negative for loss  of vision and eye pain.  Respiratory: Negative for cough, sputum, wheezing.   Cardiovascular: See HPI. Gastrointestinal: Negative for abdominal pain, melena, and hematochezia.  Genitourinary: Negative for dysuria and hematuria.  Musculoskeletal: Negative for falls and myalgias.  Skin: Negative for itching and rash.  Neurological: Negative for focal weakness, focal sensory changes and loss of consciousness.  Endo/Heme/Allergies: Does not bruise/bleed easily.    EKGs/Labs/Other Studies Reviewed:    The following studies  were reviewed today: Cardiac CT 01/07/19 IMPRESSION: 1. No evidence of CAD, CADRADS = 0. 2. Coronary calcium score of 0. This was 0 percentile for age and sex matched control.  3. Normal coronary origin with left dominance.  Echo 08/06/2018  1. The left ventricle has normal systolic function with an ejection fraction of 60-65%. The cavity size was normal. Left ventricular diastolic parameters were normal. No evidence of left ventricular regional wall motion abnormalities.  2. The right ventricle has normal systolic function. The cavity was normal. There is no increase in right ventricular wall thickness. Right ventricular systolic pressure could not be assessed.  3. The aortic valve is tricuspid. No stenosis of the aortic valve.  4. The aorta is normal in size and structure.  EKG:  EKG is personally reviewed.  The ekg ordered today demonstrates NSR at 65 bpm  Recent Labs: 07/02/2018: ALT 43; Hemoglobin 16.9; Platelets 153.0; TSH 1.19 01/06/2019: BUN 11; Creatinine, Ser 1.04; Potassium 4.3; Sodium 140  Recent Lipid Panel    Component Value Date/Time   CHOL 220 (H) 07/02/2018 1213   TRIG 101.0 07/02/2018 1213   HDL 61.90 07/02/2018 1213   CHOLHDL 4 07/02/2018 1213   VLDL 20.2 07/02/2018 1213   LDLCALC 138 (H) 07/02/2018 1213    Physical Exam:    VS:  BP (!) 146/84   Pulse 97   Temp 97.7 F (36.5 C)   Ht 6' 5.5" (1.969 m)   Wt 218 lb 12.8 oz (99.2 kg)   SpO2 96%   BMI 25.61 kg/m     Wt Readings from Last 3 Encounters:  01/08/19 218 lb 12.8 oz (99.2 kg)  11/05/18 221 lb (100.2 kg)  08/12/18 227 lb 14.4 oz (103.4 kg)    GEN: Well nourished, well developed in no acute distress HEENT: Normal, moist mucous membranes NECK: No JVD CARDIAC: regular rhythm, normal S1 and S2, no rubs or gallops. No murmur. VASCULAR: Radial and DP pulses 2+ bilaterally. No carotid bruits RESPIRATORY:  Clear to auscultation without rales, wheezing or rhonchi  ABDOMEN: Soft, non-tender,  non-distended MUSCULOSKELETAL:  Ambulates independently SKIN: Warm and dry, no edema NEUROLOGIC:  Alert and oriented x 3. No focal neuro deficits noted. PSYCHIATRIC:  Normal affect   ASSESSMENT:    1. Encounter to discuss test results   2. Paroxysmal atrial fibrillation (HCC)   3. Essential hypertension   4. Cardiac risk counseling   5. Counseling on health promotion and disease prevention    PLAN:    Test results: discussed both Kardiamobile strips and CT cardiac today. -calcium score 0, no CAD on scan, low 10-15 year risk of CV events -counseled on primary prevention, see below  Intermittent palpitations, found to have paroxysmal atrial fibrillation by KardiaMobile:  -rare, though very symptomatic events -feels better OFF metoprolol -chadsvasc=1, no indication for anticoagulation at this time -discussed pill in a pocket strategy. He prefers to just monitor for now -discussed importance of healthy weight (he has lost significant weight), blood pressure control. Monitor for sleep apnea symptoms as well.  Hypertension: now off medication after weight loss, continue to monitor. Slightly elevated in office but has been well controlled.  Cardiac risk counseling and prevention recommendations: -recommend heart healthy/Mediterranean diet, with whole grains, fruits, vegetable, fish, lean meats, nuts, and olive oil. Limit salt. -recommend moderate walking, 3-5 times/week for 30-50 minutes each session. Aim for at least 150 minutes.week. Goal should be pace of 3 miles/hours, or walking 1.5 miles in 30 minutes -recommend avoidance of tobacco products. Avoid excess alcohol. -ASCVD risk score: The ASCVD Risk score Denman George DC Jr., et al., 2013) failed to calculate for the following reasons:   The 2013 ASCVD risk score is only valid for ages 32 to 57    Plan for follow up: 2 mos  Total time of encounter: 32 minutes total time of encounter, including 22 minutes spent in face-to-face patient  care. This time includes coordination of care and counseling regarding afib, test results. Remainder of non-face-to-face time involved reviewing chart documents/testing relevant to the patient encounter and documentation in the medical record.  Jodelle Red, MD, PhD [place]  [facility]   Medication Adjustments/Labs and Tests Ordered: Current medicines are reviewed at length with the patient today.  Concerns regarding medicines are outlined above.  No orders of the defined types were placed in this encounter.  No orders of the defined types were placed in this encounter.   Patient Instructions  Medication Instructions:  Your Physician recommend you continue on your current medication as directed.    *If you need a refill on your cardiac medications before your next appointment, please call your pharmacy*  Lab Work: None  Testing/Procedures: None  Follow-Up: At [facility], you and your health needs are our priority.  As part of our continuing mission to provide you with exceptional heart care, we have created designated Provider Care Teams.  These Care Teams include your primary Cardiologist (physician) and Advanced Practice Providers (APPs -  Physician Assistants and Nurse Practitioners) who all work together to provide you with the care you need, when you need it.  Your next appointment:   1 year(s)  The format for your next appointment:   In Person  Provider:   Jodelle Red, MD     Signed, Jodelle Red, MD PhD 01/08/2019     [facility]

## 2019-01-11 ENCOUNTER — Encounter: Payer: Self-pay | Admitting: Cardiology

## 2019-01-11 DIAGNOSIS — I480 Paroxysmal atrial fibrillation: Secondary | ICD-10-CM | POA: Insufficient documentation

## 2019-04-13 ENCOUNTER — Encounter: Payer: Self-pay | Admitting: *Deleted

## 2019-04-13 DIAGNOSIS — Z006 Encounter for examination for normal comparison and control in clinical research program: Secondary | ICD-10-CM

## 2019-04-13 NOTE — Research (Signed)
CADFEM  90 DAY FOLLWUP   Patient responded back to email states he has been doing well since having CT. And they keeping his blood pressure down has help a lot.       Lance Jordan  04/13/2019  12:15 p.m.

## 2019-11-26 IMAGING — CT CT ABDOMEN W/ CM
2 of 4 series · 16 of 46 positions shown, 18 images · IV contrast (iopamidol)
Comparison: None

CLINICAL DATA: Abdominal pain.

EXAM:
CT ABDOMEN WITH CONTRAST
TECHNIQUE: Multidetector CT imaging of the abdomen was performed using the
standard protocol following bolus administration of intravenous
contrast.
CONTRAST:  80mL [id number] IOPAMIDOL ([id number]) INJECTION 61%

[Series 2: abdomen w 5.0 i40f 2 · axial · 0.80mm/px · z∈[-311,-76]mm · 13 of 53 slices shown, 15 images]
[im 3/53  soft-tissue]
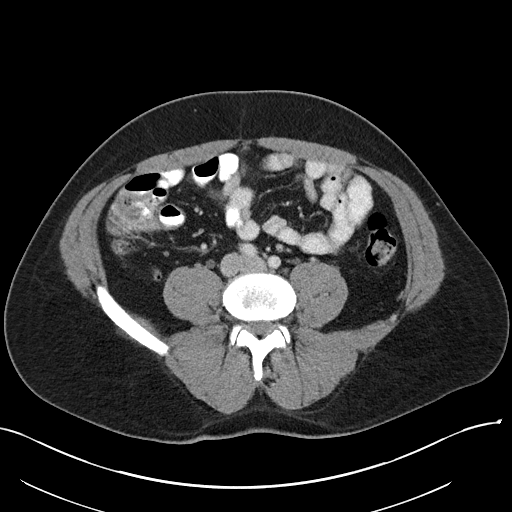
[im 3/53  bone]
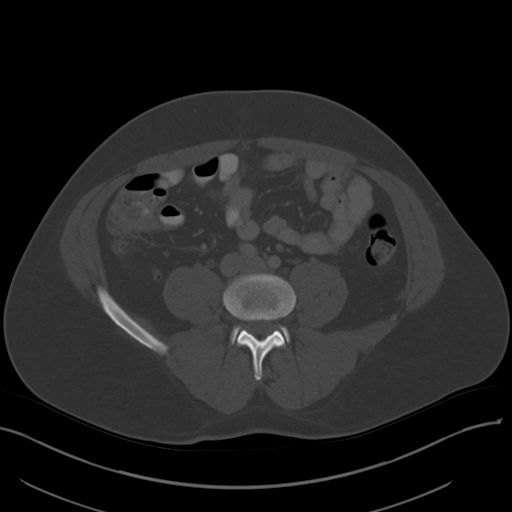
[im 8/53  soft-tissue]
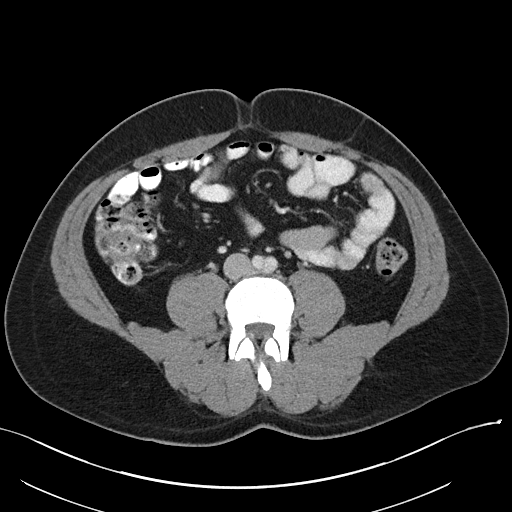
[im 10/53  soft-tissue]
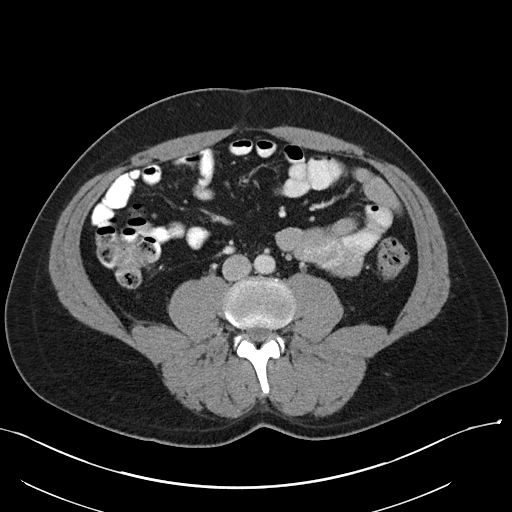
[im 15/53  soft-tissue]
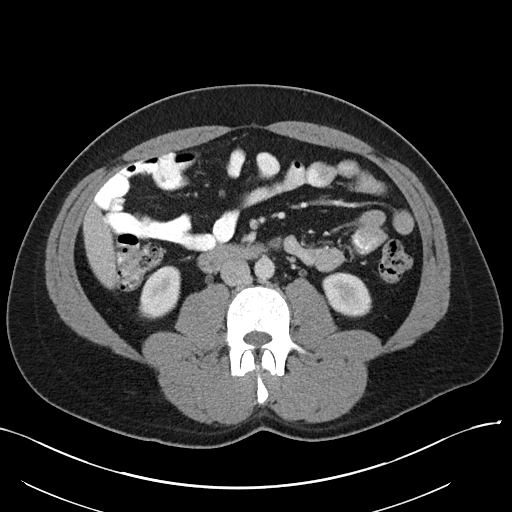
[im 18/53  soft-tissue]
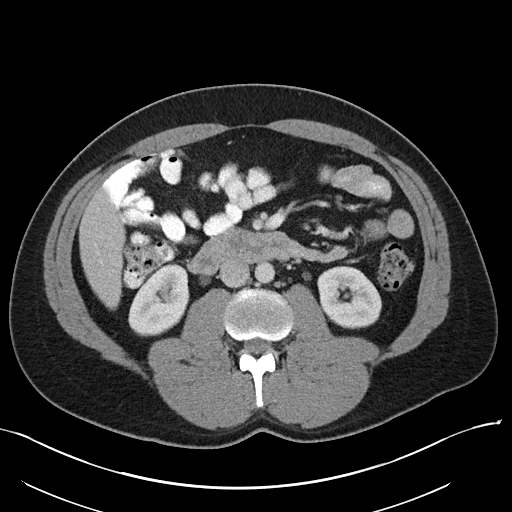
[im 23/53  soft-tissue]
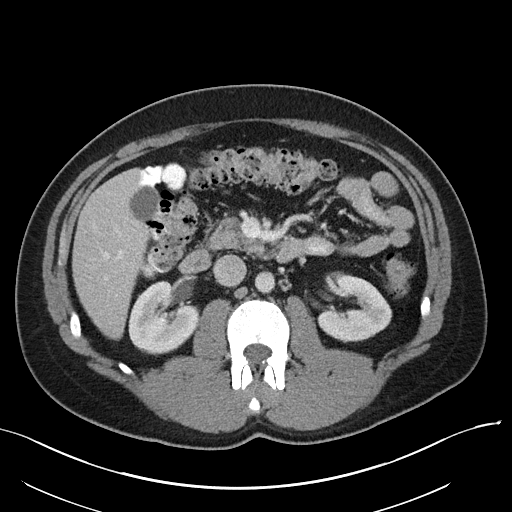
[im 28/53  soft-tissue]
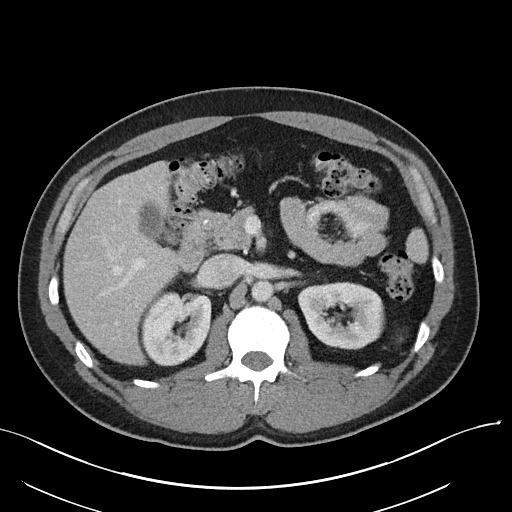
[im 30/53  soft-tissue]
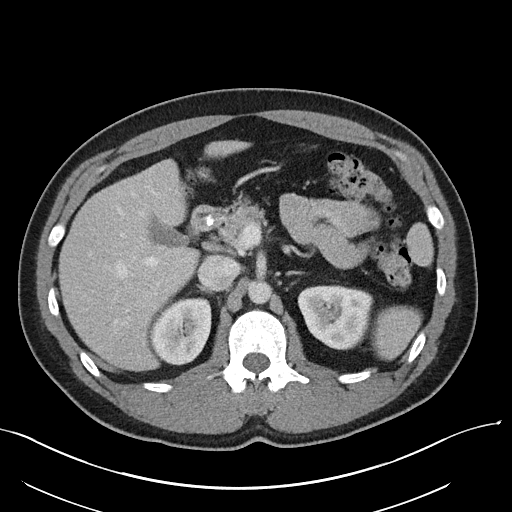
[im 35/53  soft-tissue]
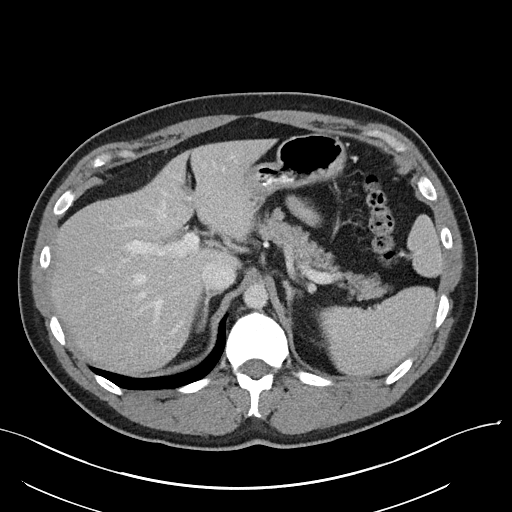
[im 35/53  bone]
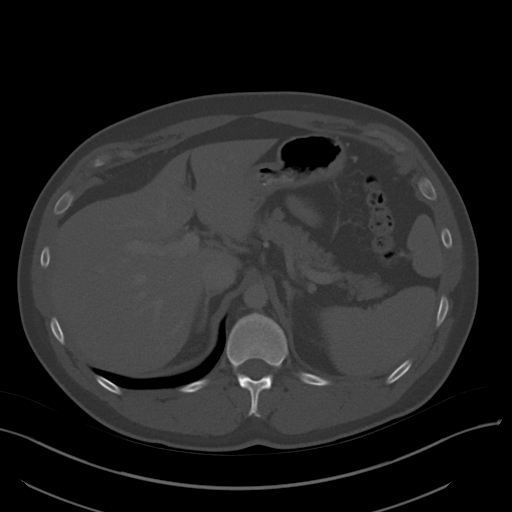
[im 38/53  soft-tissue]
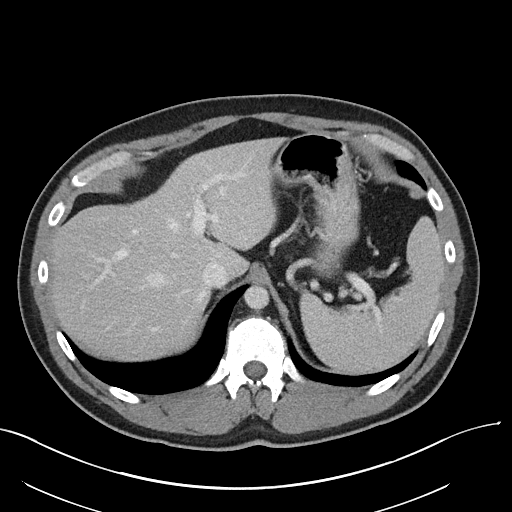
[im 43/53  soft-tissue]
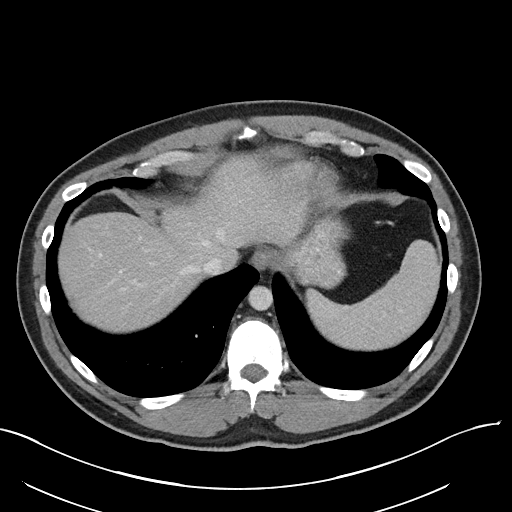
[im 45/53  soft-tissue]
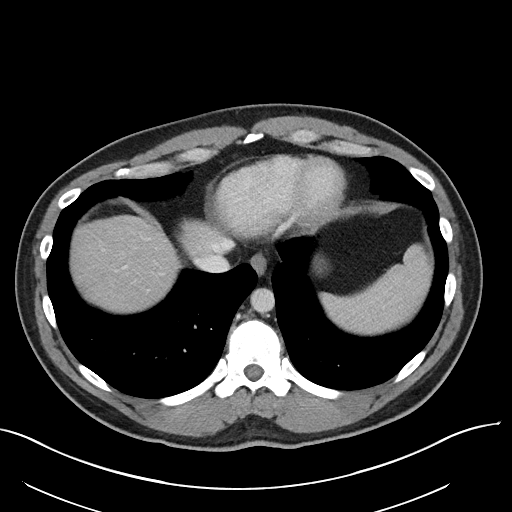
[im 50/53  soft-tissue]
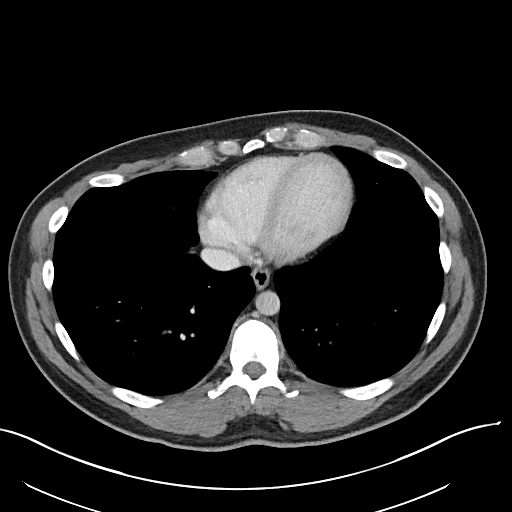

[Series 5: coronal · coronal · 0.52mm/px · 3 of 94 slices shown]
[im 32/94  soft-tissue]
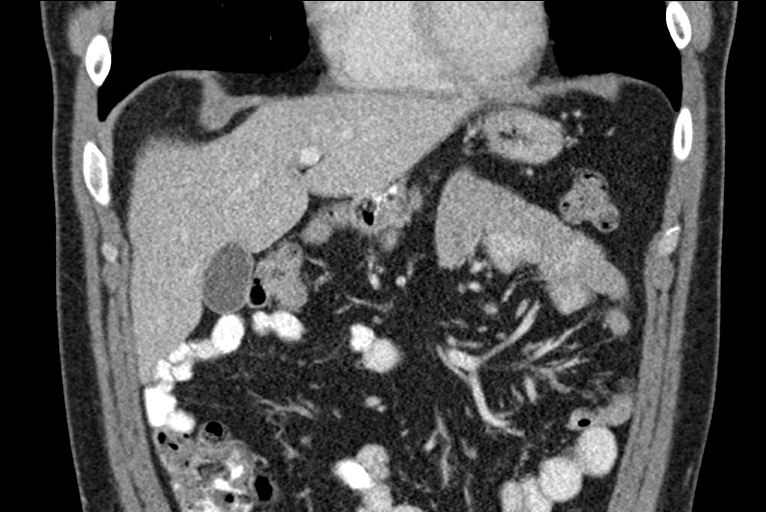
[im 42/94  soft-tissue]
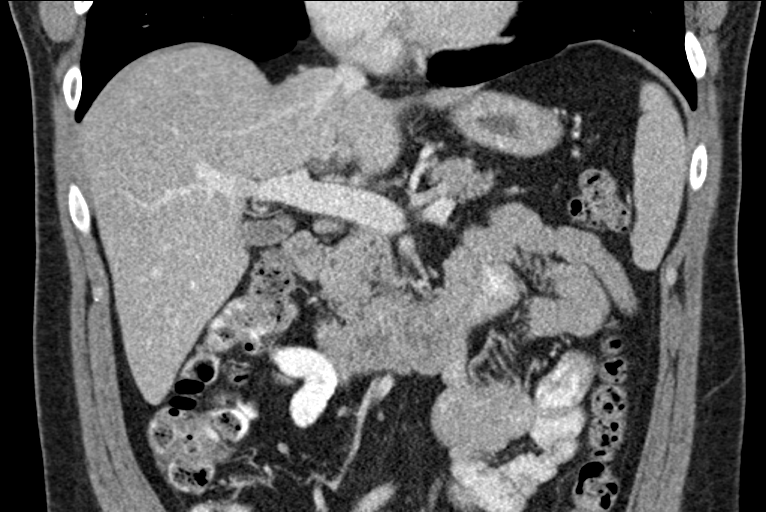
[im 52/94  soft-tissue]
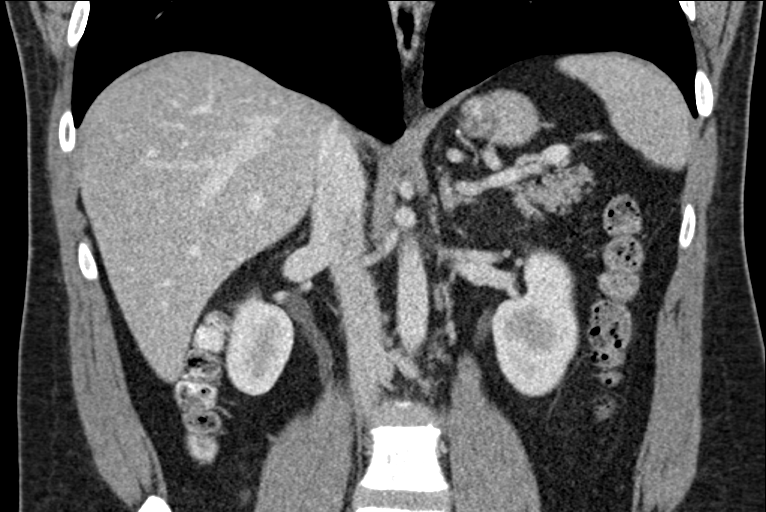

[16 of 46 positions shown; findings below may reference images not displayed]

FINDINGS: Lower chest: Lungs are clear.  No acute abnormality.

Hepatobiliary: No focal liver abnormality is seen. No gallstones,
gallbladder wall thickening, or biliary dilatation.

Pancreas: Unremarkable. No pancreatic ductal dilatation or
surrounding inflammatory changes.

Spleen: Normal in size without focal abnormality.

Adrenals/Urinary Tract: The adrenal glands appear normal.

Small left kidney cyst measuring 8 mm. No mass or hydronephrosis
identified.

Stomach/Bowel: The stomach is normal. The small bowel loops have a
normal course and caliber. The visualized portions of the appendix
appear normal, image 53 of series 2. No pathologic dilatation of the
colon.

Vascular/Lymphatic: Normal appearance of the abdominal aorta. No
abdominal adenopathy identified.

Other: No free fluid or fluid collections.

Musculoskeletal: No acute or significant osseous findings.
IMPRESSION: 1. No acute findings. No findings to explain patient's abdominal
pain.

## 2020-02-03 ENCOUNTER — Ambulatory Visit: Payer: 59 | Admitting: Cardiology

## 2020-03-29 ENCOUNTER — Ambulatory Visit (INDEPENDENT_AMBULATORY_CARE_PROVIDER_SITE_OTHER): Payer: 59 | Admitting: Cardiology

## 2020-03-29 ENCOUNTER — Other Ambulatory Visit: Payer: Self-pay

## 2020-03-29 ENCOUNTER — Encounter: Payer: Self-pay | Admitting: Cardiology

## 2020-03-29 VITALS — BP 144/80 | HR 65 | Ht 77.5 in | Wt 233.6 lb

## 2020-03-29 DIAGNOSIS — I480 Paroxysmal atrial fibrillation: Secondary | ICD-10-CM | POA: Diagnosis not present

## 2020-03-29 DIAGNOSIS — R030 Elevated blood-pressure reading, without diagnosis of hypertension: Secondary | ICD-10-CM

## 2020-03-29 DIAGNOSIS — R002 Palpitations: Secondary | ICD-10-CM

## 2020-03-29 DIAGNOSIS — Z7189 Other specified counseling: Secondary | ICD-10-CM | POA: Diagnosis not present

## 2020-03-29 NOTE — Progress Notes (Signed)
Cardiology Office Note:    Date:  03/29/2020   ID:  Lance Jordan, DOB 1980/09/15, MRN [id number]  PCP:  Philip Aspen, Limmie Patricia, MD  Cardiologist:  Jodelle Red, MD  Referring MD: Philip Aspen, [place]*   CC: follow up  History of Present Illness:    Lance Jordan is a 40 y.o. male with a hx of hypertension, palpitations, paroxysmal atrial fibrillation who is seen for follow up. I initially saw him 11/05/18 as a new consult at the request of Philip Aspen, [place]* for the evaluation and management of irregular heart beat and chest tightness.  Today: Continues to have episodes of palpitations/paroxysmal atrial fibrillation about every 6 weeks. Can last 18-24 hours at the longest, usually 6-12 hours. Better if he goes for a walk/job or goes to sleep. Again discussed pill in the pocket strategy vs monitoring. Prefers to continue monitoring.  Wakes up in the AM and has strange sensations in his chest, but heart rate isn't changing. Gets better with changing position, more pronounced if he is on his side.  Home BP 125/95 this AM. Has a lot of stress with work, gained some weight back, but still around 120s/90s on average. Working on changing jobs, decreasing stress. No really high or low numbers. 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  Denies shortness of breath at rest or with normal exertion. No PND, orthopnea, LE edema or unexpected weight gain. No syncope.  Past Medical History:  Diagnosis Date  . Heart murmur    as a child  . History of chicken pox     Past Surgical History:  Procedure Laterality Date  . HERNIA REPAIR     as a child- as surgery performed, dr realized no hernia present    Current Medications: No current outpatient medications on file prior to visit.   Current Facility-Administered Medications on File Prior to Visit  Medication  . 0.9 %  sodium chloride infusion     Allergies:   Patient has no known allergies.   Social History   Tobacco Use  .  Smoking status: Never Smoker  . Smokeless tobacco: Never Used  Vaping Use  . Vaping Use: Never used  Substance Use Topics  . Alcohol use: Yes    Alcohol/week: 14.0 standard drinks    Types: 7 Glasses of wine, 7 Cans of beer per week    Comment: 2-3 drinks every night  . Drug use: No    Family History: family history includes Arthritis in his paternal grandmother; Breast cancer in his paternal grandmother; Heart disease in his paternal grandmother; Hyperlipidemia in his mother and paternal grandmother; Ovarian cancer in his maternal grandmother. There is no history of Colon cancer, Esophageal cancer, Colon polyps, Stomach cancer, or Rectal cancer.  pat gma has had mutliple heart issues (MI, heart surgery, angioplasty), still alive in her 90s. Both parents are generally health, only BP. [name] died of MI age 85. Mother's brother died of HF in his 84s, other brother has long history of heart issues  ROS:   Please see the history of present illness.  Additional pertinent ROS otherwise unremarkable.  EKGs/Labs/Other Studies Reviewed:    The following studies were reviewed today: Cardiac CT 01/07/19 IMPRESSION: 1. No evidence of CAD, CADRADS = 0. 2. Coronary calcium score of 0. This was 0 percentile for age and sex matched control.  3. Normal coronary origin with left dominance.  Echo 08/06/2018  1. The left ventricle has normal systolic function with an ejection fraction of  60-65%. The cavity size was normal. Left ventricular diastolic parameters were normal. No evidence of left ventricular regional wall motion abnormalities.  2. The right ventricle has normal systolic function. The cavity was normal. There is no increase in right ventricular wall thickness. Right ventricular systolic pressure could not be assessed.  3. The aortic valve is tricuspid. No stenosis of the aortic valve.  4. The aorta is normal in size and structure.  EKG:  EKG is personally reviewed.  The ekg ordered today  demonstrates NSR at 65 bpm, unchanged from prior.  Recent Labs: No results found for requested labs within last 8760 hours.  Recent Lipid Panel    Component Value Date/Time   CHOL 220 (H) 07/02/2018 1213   TRIG 101.0 07/02/2018 1213   HDL 61.90 07/02/2018 1213   CHOLHDL 4 07/02/2018 1213   VLDL 20.2 07/02/2018 1213   LDLCALC 138 (H) 07/02/2018 1213    Physical Exam:    VS:  BP (!) 144/80   Pulse 65   Ht 6' 5.5" (1.969 m)   Wt 233 lb 9.6 oz (106 kg)   SpO2 98%   BMI 27.34 kg/m     Wt Readings from Last 3 Encounters:  03/29/20 233 lb 9.6 oz (106 kg)  01/08/19 218 lb 12.8 oz (99.2 kg)  11/05/18 221 lb (100.2 kg)    GEN: Well nourished, well developed in no acute distress HEENT: Normal, moist mucous membranes NECK: No JVD CARDIAC: regular rhythm, normal S1 and S2, no rubs or gallops. No murmur. VASCULAR: Radial and DP pulses 2+ bilaterally. No carotid bruits RESPIRATORY:  Clear to auscultation without rales, wheezing or rhonchi  ABDOMEN: Soft, non-tender, non-distended MUSCULOSKELETAL:  Ambulates independently SKIN: Warm and dry, no edema NEUROLOGIC:  Alert and oriented x 3. No focal neuro deficits noted. PSYCHIATRIC:  Normal affect   ASSESSMENT:    1. Paroxysmal atrial fibrillation (HCC)   2. Elevated blood pressure reading   3. Cardiac risk counseling   4. Counseling on health promotion and disease prevention   5. Heart palpitations    PLAN:    Intermittent palpitations, found to have paroxysmal atrial fibrillation by Kardia[employer]:  -rare, though very symptomatic events -feels better OFF metoprolol -chadsvasc=1, no indication for anticoagulation at this time -discussed pill in a pocket strategy. He prefers to just monitor for now -discussed importance of healthy weight, blood pressure control. Monitor for sleep apnea symptoms as well.  Hypertension: now off medication -Slightly elevated in office but has been well controlled at home. Will continue to monitor,  will call me if consistently elevated.  Cardiac risk counseling and prevention recommendations: -recommend heart healthy/Mediterranean diet, with whole grains, fruits, vegetable, fish, lean meats, nuts, and olive oil. Limit salt. -recommend moderate walking, 3-5 times/week for 30-50 minutes each session. Aim for at least 150 minutes.week. Goal should be pace of 3 miles/hours, or walking 1.5 miles in 30 minutes -recommend avoidance of tobacco products. Avoid excess alcohol. -ASCVD risk score: The ASCVD Risk score Denman George DC Jr., et al., 2013) failed to calculate for the following reasons:   The 2013 ASCVD risk score is only valid for ages 53 to 62   -calcium score 0, no CAD on scan, low 10-15 year risk of CV events  Plan for follow up: 1 year or sooner PRN  Jodelle Red, MD, PhD, [facility]  [facility]   Medication Adjustments/Labs and Tests Ordered: Current medicines are reviewed at length with the patient today.  Concerns regarding medicines are  outlined above.  Orders Placed This Encounter  Procedures  . EKG 12-Lead   No orders of the defined types were placed in this encounter.   Patient Instructions  Medication Instructions:  Your Physician recommend you continue on your current medication as directed.    *If you need a refill on your cardiac medications before your next appointment, please call your pharmacy*   Lab Work: None   Testing/Procedures: None   Follow-Up: At [facility], you and your health needs are our priority.  As part of our continuing mission to provide you with exceptional heart care, we have created designated Provider Care Teams.  These Care Teams include your primary Cardiologist (physician) and Advanced Practice Providers (APPs -  Physician Assistants and Nurse Practitioners) who all work together to provide you with the care you need, when you need it.  We recommend signing up for the patient portal called "MyChart".  Sign up  information is provided on this After Visit Summary.  MyChart is used to connect with patients for Virtual Visits (Telemedicine).  Patients are able to view lab/test results, encounter notes, upcoming appointments, etc.  Non-urgent messages can be sent to your provider as well.   To learn more about what you can do with MyChart, go to [email or web address].    Your next appointment:   1 year(s) @ [address] [place], [place] [address]   The format for your next appointment:   In Person  Provider:   Jodelle Red, MD       Signed, Jodelle Red, MD PhD 03/29/2020     [facility]

## 2020-03-29 NOTE — Patient Instructions (Signed)
Medication Instructions:  Your Physician recommend you continue on your current medication as directed.    *If you need a refill on your cardiac medications before your next appointment, please call your pharmacy*   Lab Work: None   Testing/Procedures: None   Follow-Up: At [facility], you and your health needs are our priority.  As part of our continuing mission to provide you with exceptional heart care, we have created designated Provider Care Teams.  These Care Teams include your primary Cardiologist (physician) and Advanced Practice Providers (APPs -  Physician Assistants and Nurse Practitioners) who all work together to provide you with the care you need, when you need it.  We recommend signing up for the patient portal called "MyChart".  Sign up information is provided on this After Visit Summary.  MyChart is used to connect with patients for Virtual Visits (Telemedicine).  Patients are able to view lab/test results, encounter notes, upcoming appointments, etc.  Non-urgent messages can be sent to your provider as well.   To learn more about what you can do with MyChart, go to [email or web address].    Your next appointment:   1 year(s) @ [address] [place], [place] [address]  The format for your next appointment:   In Person  Provider:   Bridgette Christopher, MD   

## 2021-10-13 IMAGING — CT CT HEART MORP W/ CTA COR W/ SCORE W/ CA W/CM &/OR W/O CM
4 of 7 series · 8 of 20 positions shown, 9 images · IV contrast (APPLIED)
Comparison: None.
COMPARISON: None.

Addendum:
EXAM:
OVER-READ INTERPRETATION  CT CHEST

The following report is an over-read performed by radiologist Dr.
Sabina Subramanian [REDACTED] on 01/07/2019. This
over-read does not include interpretation of cardiac or coronary
anatomy or pathology. The coronary calcium score/coronary CTA
interpretation by the cardiologist is attached.
HISTORY: Chest pain, normal ekg chest pain
Cardiac/Coronary CT
TECHNIQUE: The patient was scanned on a Siemens Force scanner.
PROTOCOL: A 120 kV prospective scan was triggered in the descending thoracic
aorta at 111 HU's. Axial non-contrast 3 mm slices were carried out
through the heart. The data set was analyzed on a dedicated work
station and scored using the Agatson method. Gantry rotation speed
was 250 msecs and collimation was 0.6 mm. Beta blockade and 0.8 mg
of sl NTG was given. The 3D data set was reconstructed in 5%
intervals of 35-75% of the R-R cycle. Diastolic phases were analyzed
on a dedicated work station using MPR, MIP and VRT modes. The
patient received 100mL OMNIPAQUE IOHEXOL 350 MG/ML SOLN of contrast.

[Series 6: best diast 69 % · axial · 0.32mm/px · z∈[+983,+1030]mm · 2 of 356 slices shown, 3 images]
[im 119/356  vessel]
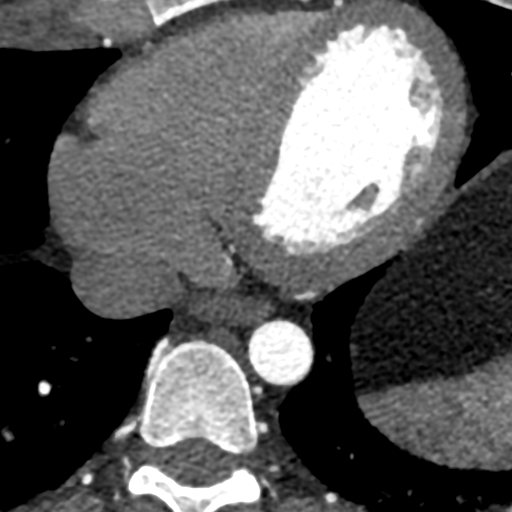
[im 119/356  lung]
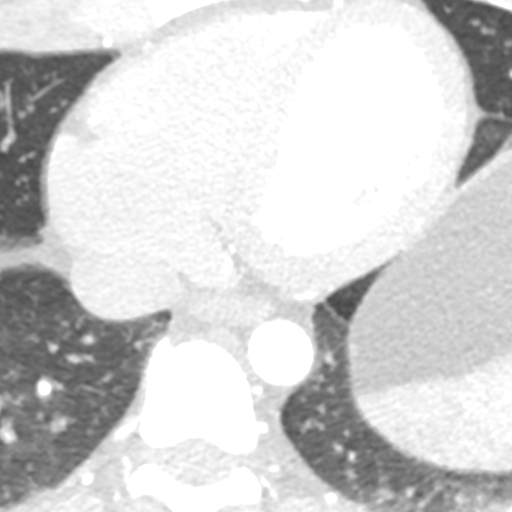
[im 237/356  vessel]
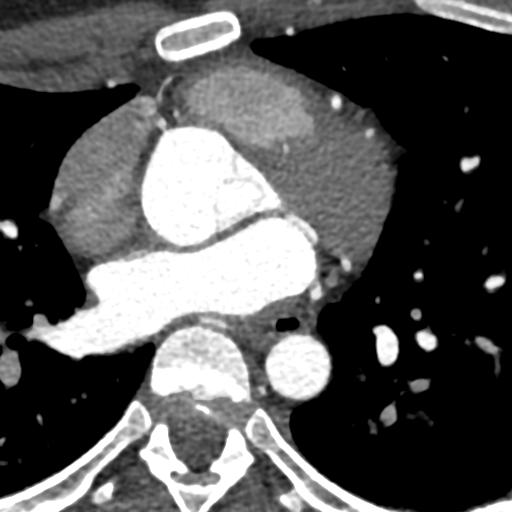

[Series 7: best syst 36 % · axial · 0.32mm/px · z∈[+983,+1030]mm · 2 of 356 slices shown]
[im 119/356  vessel]
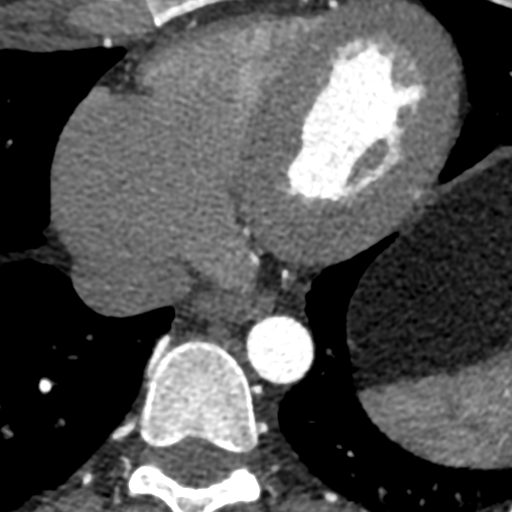
[im 237/356  vessel]
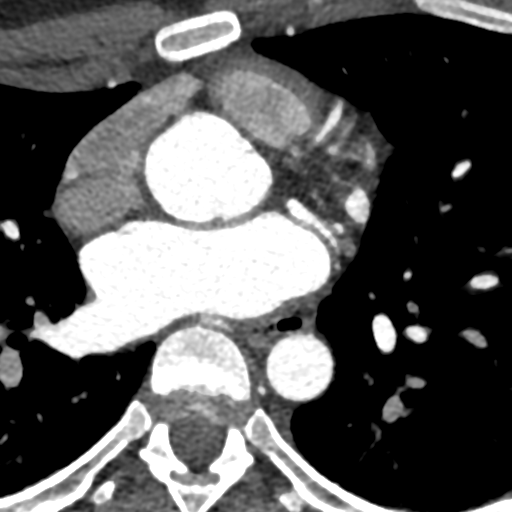

[Series 8: ts diast sharp 36 % · axial · 0.32mm/px · z∈[+983,+1030]mm · 2 of 356 slices shown]
[im 119/356  lung]
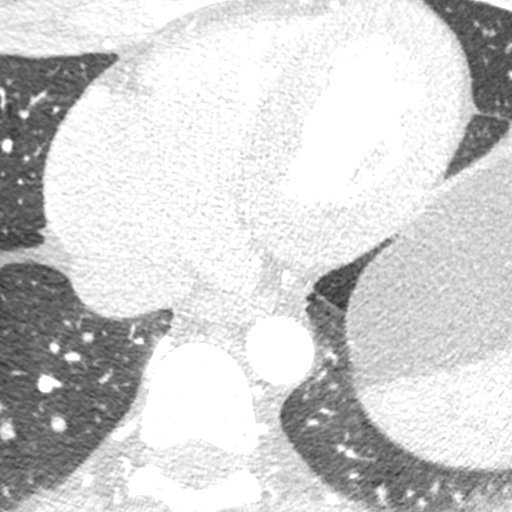
[im 237/356  lung]
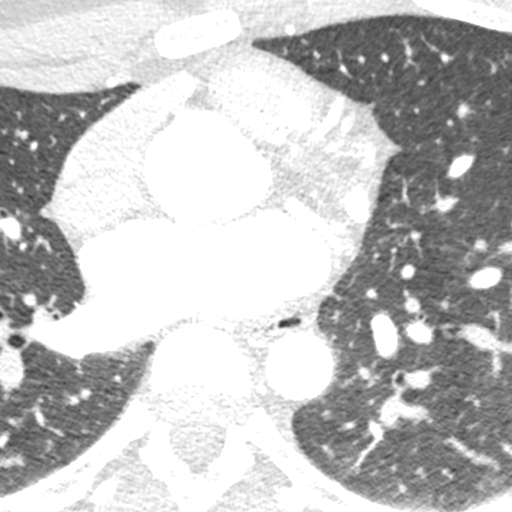

[Series 9: ts syst sharp 36 % · axial · 0.32mm/px · z∈[+983,+1030]mm · 2 of 356 slices shown]
[im 119/356  lung]
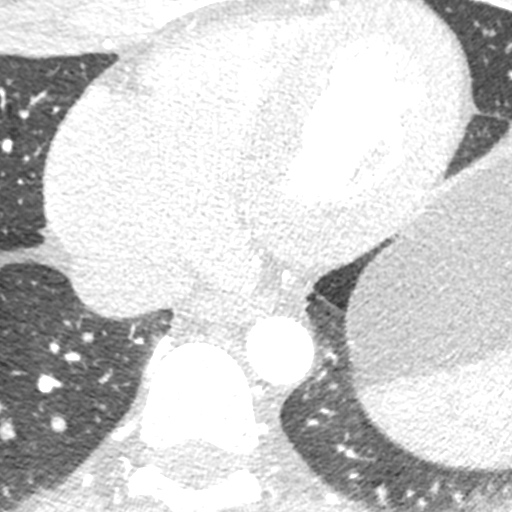
[im 237/356  lung]
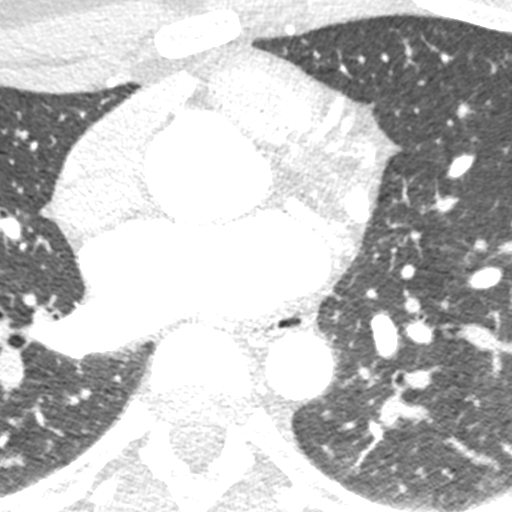

[8 of 20 positions shown; findings below may reference images not displayed]

FINDINGS: Within the visualized portions of the thorax there are no suspicious
appearing pulmonary nodules or masses, there is no acute
consolidative airspace disease, no pleural effusions, no
pneumothorax and no lymphadenopathy. Visualized portions of the
upper abdomen are unremarkable. There are no aggressive appearing
lytic or blastic lesions noted in the visualized portions of the
skeleton.
IMPRESSION: 1. No significant incidental noncardiac findings are noted.
FINDINGS: Coronary calcium score: The patient's coronary artery calcium score
is 0, which places the patient in the 0 percentile.

Coronary arteries: Normal coronary origins.  Left dominance.

Right Coronary Artery: Small caliber vessel. No significant plaque
or stenosis.

Left Main Coronary Artery: Normal caliber vessel. No significant
plaque or stenosis.

Left Anterior Descending Coronary Artery: Normal caliber vessel. No
significant plaque or stenosis. Gives rise to 1 large and 1 medium
diagonal branches. Very distal portion of LAD lost due to window
positioning, but on cardiac score there is no calcium noted.

Left Circumflex Artery: Large caliber vessel, gives rise to PDA. No
significant plaque or stenosis. Gives rise to large OM branch.

Aorta: Normal size, 29 mm at the mid ascending aorta (level of the
PA bifurcation) measured double oblique. No calcifications. No
dissection.

Aortic Valve: No calcifications. Trileaflet.

Other findings:

Normal pulmonary vein drainage into the left atrium. Normal variant
of left common pulmonary vein ostium, with left upper and left lower
pulmonary vein draining into common trunk.

Normal left atrial appendage without a thrombus.

Normal size of the pulmonary artery.
IMPRESSION: 1. No evidence of CAD, CADRADS = 0.

2. Coronary calcium score of 0. This was 0 percentile for age and
sex matched control.

3. Normal coronary origin with left dominance.

*** End of Addendum ***
EXAM:
OVER-READ INTERPRETATION  CT CHEST

The following report is an over-read performed by radiologist Dr.
Sabina Subramanian [REDACTED] on 01/07/2019. This
over-read does not include interpretation of cardiac or coronary
anatomy or pathology. The coronary calcium score/coronary CTA
interpretation by the cardiologist is attached.
FINDINGS: Within the visualized portions of the thorax there are no suspicious
appearing pulmonary nodules or masses, there is no acute
consolidative airspace disease, no pleural effusions, no
pneumothorax and no lymphadenopathy. Visualized portions of the
upper abdomen are unremarkable. There are no aggressive appearing
lytic or blastic lesions noted in the visualized portions of the
skeleton.
IMPRESSION: 1. No significant incidental noncardiac findings are noted.
